# Patient Record
Sex: Male | Born: 1935 | Race: White | Hispanic: No | Smoking: Former smoker
Health system: Southern US, Community
[De-identification: ages and names within clinical notes are randomized; demographics above are authoritative.]

## PROBLEM LIST (undated history)

## (undated) DIAGNOSIS — Z7901 Long term (current) use of anticoagulants: Secondary | ICD-10-CM

## (undated) DIAGNOSIS — I4891 Unspecified atrial fibrillation: Secondary | ICD-10-CM

## (undated) DIAGNOSIS — K08109 Complete loss of teeth, unspecified cause, unspecified class: Secondary | ICD-10-CM

## (undated) DIAGNOSIS — T82330A Leakage of aortic (bifurcation) graft (replacement), initial encounter: Secondary | ICD-10-CM

## (undated) DIAGNOSIS — IMO0002 Reserved for concepts with insufficient information to code with codable children: Secondary | ICD-10-CM

## (undated) DIAGNOSIS — I10 Essential (primary) hypertension: Secondary | ICD-10-CM

## (undated) DIAGNOSIS — Z972 Presence of dental prosthetic device (complete) (partial): Secondary | ICD-10-CM

## (undated) HISTORY — PX: HERNIA REPAIR: SHX51

## (undated) HISTORY — PX: CHOLECYSTECTOMY: SHX55

## (undated) HISTORY — PX: EYE SURGERY: SHX253

## (undated) HISTORY — PX: ABDOMINAL AORTIC ANEURYSM REPAIR: SUR1152

## (undated) HISTORY — PX: MULTIPLE TOOTH EXTRACTIONS: SHX2053

---

## 2010-11-15 ENCOUNTER — Ambulatory Visit (HOSPITAL_BASED_OUTPATIENT_CLINIC_OR_DEPARTMENT_OTHER)
Admission: RE | Admit: 2010-11-15 | Discharge: 2010-11-15 | Disposition: A | Discharge status: Home / Self Care | Payer: Medicare Other | Attending: Specialist | Admitting: Specialist

## 2010-11-15 DIAGNOSIS — K219 Gastro-esophageal reflux disease without esophagitis: Secondary | ICD-10-CM | POA: Insufficient documentation

## 2010-11-15 DIAGNOSIS — I4891 Unspecified atrial fibrillation: Secondary | ICD-10-CM | POA: Insufficient documentation

## 2010-11-15 DIAGNOSIS — Z7901 Long term (current) use of anticoagulants: Secondary | ICD-10-CM | POA: Insufficient documentation

## 2010-11-15 DIAGNOSIS — H269 Unspecified cataract: Secondary | ICD-10-CM | POA: Insufficient documentation

## 2010-11-15 DIAGNOSIS — I10 Essential (primary) hypertension: Secondary | ICD-10-CM | POA: Insufficient documentation

## 2010-11-15 DIAGNOSIS — Z01812 Encounter for preprocedural laboratory examination: Secondary | ICD-10-CM | POA: Insufficient documentation

## 2010-11-15 LAB — POCT I-STAT, CHEM 8
Calcium, Ion: 1.3 mmol/L (ref 1.12–1.32)
Glucose, Bld: 93 mg/dL (ref 70–99)
HCT: 43 % (ref 39.0–52.0)
Hemoglobin: 14.6 g/dL (ref 13.0–17.0)
Potassium: 3.4 mEq/L — ABNORMAL LOW (ref 3.5–5.1)

## 2010-11-29 ENCOUNTER — Ambulatory Visit (HOSPITAL_BASED_OUTPATIENT_CLINIC_OR_DEPARTMENT_OTHER)
Admission: RE | Admit: 2010-11-29 | Discharge: 2010-11-29 | Disposition: A | Discharge status: Home / Self Care | Payer: Medicare Other | Source: Ambulatory Visit | Attending: Specialist | Admitting: Specialist

## 2010-11-29 DIAGNOSIS — Z01818 Encounter for other preprocedural examination: Secondary | ICD-10-CM | POA: Insufficient documentation

## 2010-11-29 DIAGNOSIS — H269 Unspecified cataract: Secondary | ICD-10-CM | POA: Insufficient documentation

## 2011-05-11 NOTE — Op Note (Signed)
  NAMEPHILEMON, RIEDESEL              ACCOUNT NO.:  [id number]  MEDICAL RECORD NO.:  [id number]          PATIENT TYPE:  AMB  LOCATION:  [facility]                         FACILITY:  [facility]  PHYSICIAN:  Chucky May, M.D.  DATE OF BIRTH:  1933-03-05  DATE OF PROCEDURE:  11/23/2010 DATE OF DISCHARGE:                              OPERATIVE REPORT   PREOPERATIVE DIAGNOSIS:  Cataract in the right eye.  POSTOPERATIVE DIAGNOSIS:  Cataract in the right eye,  OPERATION PERFORMED:  Cataract extraction with lens implantation in the right eye with limbal relaxing incisions performed by LenSx laser.  SURGEON:  Chucky May, M.D.  ASSISTANT:  There were no assistants.  FINDINGS:  Presence of the cataract consistent with decrease in visual acuity.  DESCRIPTION OF PROCEDURE:  The patient was brought to the main operating room after having received a LenSx laser treatment in the laser room performing limbal relaxing incisions and fragmentation of the nucleus as well as primary and secondary incisions and a capsulorrhexis.  The patient was then prepped and draped in the usual manner.  A lid speculum inserted and an entry into the anterior chamber was made through the primary laser incision.  The secondary side port was then opened by sharp dissection.  The capsulorrhexis was inspected and noted to be complete and was removed by capsulorrhexis forceps.  Nucleus was then mobilized by hydrodissection with 1% nonpreserved lidocaine. Phacoemulsification of the lens was then accomplished without difficulty.  The posterior capsule was polished.  Provisc was inserted and an intraocular lens was placed in the bag without difficulty.  The Provisc was removed by irrigation and aspiration.  The wound was closed by hydrodissection and checked for fluid leaks and none were noted.  The eye was dressed with topical Pred Forte and Vigamox drops and the patient was taken to the recovery room in excellent  condition where he and his family received written and verbal postoperative instructions and were scheduled for followup in 24 hours.          ______________________________ Chucky May, M.D.     DJD/MEDQ  D:  11/23/2010  T:  11/23/2010  Job:  [id number]  Electronically Signed by Nelson Chimes M.D. on 05/11/2011 09:35:19 AM

## 2011-05-11 NOTE — Op Note (Signed)
  [name], [name]              ACCOUNT NO.:  [id number]  MEDICAL RECORD NO.:  [id number]          PATIENT TYPE:  AMB  LOCATION:  [facility]                         FACILITY:  [facility]  PHYSICIAN:  Chucky May, M.D.  DATE OF BIRTH:  [birth year]  DATE OF PROCEDURE:  12/01/2010 DATE OF DISCHARGE:                              OPERATIVE REPORT   PREOPERATIVE DIAGNOSIS:  Cataract right eye.  POSTOPERATIVE DIAGNOSIS:  Cataract right eye.  INDICATIONS FOR SURGERY:  The patient is a 77 year old male with painless progressive decrease in vision, suggests difficulties seeing for reading.  PROCEDURE:  The patient was brought to the main operating room and placed in the supine position.  Anesthesia was obtained by means of topical 4% lidocaine drops with tetracaine.  The cornea was then entered temporally by a micro keratome with an additional port superiorly for paracentesis.  A circular capsular rhexis was then performed in a curvilinear fashion without difficulty.  The nucleus was mobilized by hydrodissection, followed by phacoemulsification of the nucleus without difficulty.  Cortical material was removed by irrigation and aspiration, and the posterior capsule was polished.  A posterior chamber lens implant, model SN60WF of 19.5 diopters was then placed in the bag without difficulty.  Residual cortical material was removed by irrigation and aspiration.  The wounds were hydrated with balanced salt solution and checked for fluid leaks and none were noted.  Eye was dressed with topical Pred Forte and Vigamox as well as a protective shield, and the patient was taken to the recovery room in excellent condition where he received written and verbal instructions for his postoperative care and was scheduled for a followup in 24 hours.          ______________________________ Chucky May, M.D.     DJD/MEDQ  D:  11/30/2010  T:  12/01/2010  Job:  [id number]  Electronically Signed by Nelson Chimes M.D. on 05/11/2011 09:35:28 AM

## 2015-11-30 ENCOUNTER — Other Ambulatory Visit: Payer: Self-pay | Admitting: Surgery

## 2015-11-30 DIAGNOSIS — T82330A Leakage of aortic (bifurcation) graft (replacement), initial encounter: Secondary | ICD-10-CM

## 2015-11-30 DIAGNOSIS — IMO0002 Reserved for concepts with insufficient information to code with codable children: Secondary | ICD-10-CM

## 2015-12-01 ENCOUNTER — Ambulatory Visit
Admission: RE | Admit: 2015-12-01 | Discharge: 2015-12-01 | Disposition: A | Discharge status: Home / Self Care | Payer: Medicare Other | Source: Ambulatory Visit | Attending: Surgery | Admitting: Surgery

## 2015-12-01 DIAGNOSIS — T82330A Leakage of aortic (bifurcation) graft (replacement), initial encounter: Secondary | ICD-10-CM

## 2015-12-01 DIAGNOSIS — IMO0002 Reserved for concepts with insufficient information to code with codable children: Secondary | ICD-10-CM

## 2015-12-01 HISTORY — DX: Unspecified atrial fibrillation: I48.91

## 2015-12-01 HISTORY — DX: Long term (current) use of anticoagulants: Z79.01

## 2015-12-01 HISTORY — DX: Essential (primary) hypertension: I10

## 2015-12-01 NOTE — Consult Note (Signed)
Chief Complaint: Patient was seen in consultation today for  Chief Complaint  Patient presents with  . Advice Only    Consult for Endoleak Repair     at the request of Cruz,Nestor Jr.  Referring Physician(s): [employer].  History of Present Illness: Jimmy Burke is a 82 y.o. male who underwent an abdominal aortic and biiliac stent graft for abdominal aortic aneurysm in 2015. He was followed by sonography and finally underwent a follow-up CTA early this year which showed a minimal type II endoleak. Preoperative aneurysm sac diameter was 5.6 cm in the postoperative aneurysm sac diameter by CT angiogram was measured at 6.4 cm. 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 The right-sided endoleak is prevented from repair by the IVC. Access into the left sided endoleak is possible. Access into the left lumbar artery is also possible. He denies any symptoms. Specifically, he has no abdominal pain, back pain, or lower extremity claudication symptoms. Limbs of the graft are also patent. He has a history of atrial fibrillation and is on Eliquis which likely contributes to delayed thrombosis. There are no interim CT angiograms. There are no interim CT angiograms. Past Medical History  Diagnosis Date  . Hypertension   . Atrial fibrillation ([place])   . Chronic anticoagulation     Past Surgical History  Procedure Laterality Date  . Cholecystectomy    . Abdominal aortic aneurysm repair    . Hernia repair Right     inguinal hernia  . Eye surgery      Bilateral cataract surgery      Allergies: Review of patient's allergies indicates no known allergies.  Medications: Prior to Admission medications   Medication Sig Start Date End Date Taking? Authorizing Provider  apixaban (ELIQUIS) 2.5 MG TABS tablet Take 2.5 mg by mouth 2 (two) times daily.   Yes Historical Provider, MD  aspirin 81 MG tablet Take 81 mg by mouth daily.   Yes Historical Provider,  MD  calcium carbonate 1250 MG capsule Take 1,250 mg by mouth 2 (two) times daily.   Yes Historical Provider, MD  diltiazem (CARDIZEM) 60 MG tablet Take 60 mg by mouth 2 (two) times daily.   Yes Historical Provider, MD  donepezil (ARICEPT) 5 MG tablet Take 5 mg by mouth at bedtime.   Yes Historical Provider, MD  hydrochlorothiazide (HYDRODIURIL) 25 MG tablet Take 25 mg by mouth daily. Take 1/4 tablet daily.   Yes Historical Provider, MD  ketoconazole (NIZORAL) 2 % cream Apply 1 application topically daily.   Yes Historical Provider, MD  metoprolol (LOPRESSOR) 100 MG tablet Take 100 mg by mouth 2 (two) times daily.   Yes Historical Provider, MD  simvastatin (ZOCOR) 20 MG tablet Take 20 mg by mouth daily.   Yes Historical Provider, MD     No family history on file.  Social History   Social History  . Marital Status: Widowed    Spouse Name: N/A  . Number of Children: N/A  . Years of Education: N/A   Social History Main Topics  . Smoking status: Former Research scientist (life sciences)  . Smokeless tobacco: Never Used  . Alcohol Use: 0.0 oz/week    0 Standard drinks or equivalent per week     Comment: occasionally    . Drug Use: No  . Sexual Activity: Not on file   Other Topics Concern  . Not on file   Social History Narrative  . No narrative on file  Review of Systems: A 12 point ROS discussed and pertinent positives are indicated in the HPI above.  All other systems are negative.  Review of Systems  Vital Signs: BP 167/88 mmHg  Pulse 85  Temp(Src) 97.8 F (36.6 C) (Oral)  Resp 14  Ht '5\' 6"'$  (1.676 m)  Wt 124 lb (56.246 kg)  BMI 20.02 kg/m2  SpO2 96%  Physical Exam  Constitutional: He is oriented to person, place, and time. He appears well-developed and well-nourished.  Cardiovascular: Normal rate.   Irregular consistent with atrial fibrillation  Pulmonary/Chest: Effort normal and breath sounds normal.  Abdominal: Soft.  Musculoskeletal:  Femoral pulses 2+ bilaterally.  Dorsalis pedis  and posterior tibial pulses are 1+ bilaterally  Neurological: He is alert and oriented to person, place, and time.  Skin: Skin is warm and dry.    Mallampati Score:    deferred  Imaging: No results found.  Labs:  CBC: No results for input(s): WBC, HGB, HCT, PLT in the last 8760 hours.  COAGS: No results for input(s): INR, APTT in the last 8760 hours.  BMP: No results for input(s): NA, K, CL, CO2, GLUCOSE, BUN, CALCIUM, CREATININE, GFRNONAA, GFRAA in the last 8760 hours.  Invalid input(s): CMP  LIVER FUNCTION TESTS: No results for input(s): BILITOT, AST, ALT, ALKPHOS, PROT, ALBUMIN in the last 8760 hours.  TUMOR MARKERS: No results for input(s): AFPTM, CEA, CA199, CHROMGRNA in the last 8760 hours.  Assessment and Plan:  Mr. Posten is status post aortobiiliac stent graft repair for aortic aneurysm and does have a type II endoleak. A recent CT angiogram is compared to preoperative CT angiograms from 2 years ago. The aneurysm sac has enlarged however there are no interim CT angiograms. I am recommending a conservative approach with a follow-up CT angiogram in 3-6 months. If the sac continues to enlarge and the endoleak continues, endoleak embolization repair would be indicated. I will order the angiogram and see him in follow-up with further recommendations. He agrees with this course of action.  Thank you for this interesting consult.  I greatly enjoyed meeting Shavon Zenz and look forward to participating in their care.  A copy of this report was sent to the requesting provider on this date.  Electronically Signed: Miel Wisener, ART A 12/01/2015, 9:11 AM   I spent a total of  40 Minutes   in face to face in clinical consultation, greater than 50% of which was  counseling/coordinating care for aortobiiliac stent graft repair and endoleak.

## 2016-04-04 ENCOUNTER — Other Ambulatory Visit (HOSPITAL_COMMUNITY): Payer: Self-pay | Admitting: Interventional Radiology

## 2016-04-04 ENCOUNTER — Other Ambulatory Visit: Payer: Self-pay | Admitting: *Deleted

## 2016-04-04 DIAGNOSIS — IMO0002 Reserved for concepts with insufficient information to code with codable children: Secondary | ICD-10-CM

## 2016-04-04 DIAGNOSIS — T82330A Leakage of aortic (bifurcation) graft (replacement), initial encounter: Secondary | ICD-10-CM

## 2016-04-20 ENCOUNTER — Encounter (HOSPITAL_BASED_OUTPATIENT_CLINIC_OR_DEPARTMENT_OTHER): Payer: Self-pay

## 2016-04-20 ENCOUNTER — Ambulatory Visit (HOSPITAL_BASED_OUTPATIENT_CLINIC_OR_DEPARTMENT_OTHER)
Admission: RE | Admit: 2016-04-20 | Discharge: 2016-04-20 | Disposition: A | Discharge status: Home / Self Care | Payer: Medicare Other | Source: Ambulatory Visit | Attending: Interventional Radiology | Admitting: Interventional Radiology

## 2016-04-20 DIAGNOSIS — IMO0002 Reserved for concepts with insufficient information to code with codable children: Secondary | ICD-10-CM

## 2016-04-20 DIAGNOSIS — N2889 Other specified disorders of kidney and ureter: Secondary | ICD-10-CM | POA: Diagnosis not present

## 2016-04-20 DIAGNOSIS — Z9889 Other specified postprocedural states: Secondary | ICD-10-CM | POA: Insufficient documentation

## 2016-04-20 DIAGNOSIS — J432 Centrilobular emphysema: Secondary | ICD-10-CM | POA: Insufficient documentation

## 2016-04-20 DIAGNOSIS — K5730 Diverticulosis of large intestine without perforation or abscess without bleeding: Secondary | ICD-10-CM | POA: Insufficient documentation

## 2016-04-20 DIAGNOSIS — T82330A Leakage of aortic (bifurcation) graft (replacement), initial encounter: Secondary | ICD-10-CM | POA: Diagnosis present

## 2016-04-20 DIAGNOSIS — X58XXXA Exposure to other specified factors, initial encounter: Secondary | ICD-10-CM | POA: Diagnosis not present

## 2016-04-20 DIAGNOSIS — I714 Abdominal aortic aneurysm, without rupture: Secondary | ICD-10-CM | POA: Insufficient documentation

## 2016-04-20 MED ORDER — IOPAMIDOL (ISOVUE-370) INJECTION 76%
100.0000 mL | Freq: Once | INTRAVENOUS | Status: AC | PRN
  Administered 2016-04-20: 100 mL via INTRAVENOUS

## 2016-05-02 ENCOUNTER — Ambulatory Visit
Admission: RE | Admit: 2016-05-02 | Discharge: 2016-05-02 | Disposition: A | Discharge status: Home / Self Care | Payer: Medicare Other | Source: Ambulatory Visit | Attending: Interventional Radiology | Admitting: Interventional Radiology

## 2016-05-02 DIAGNOSIS — T82330A Leakage of aortic (bifurcation) graft (replacement), initial encounter: Secondary | ICD-10-CM

## 2016-05-02 DIAGNOSIS — IMO0002 Reserved for concepts with insufficient information to code with codable children: Secondary | ICD-10-CM

## 2016-05-02 HISTORY — PX: IR GENERIC HISTORICAL: IMG1180011

## 2016-05-02 HISTORY — DX: Reserved for concepts with insufficient information to code with codable children: IMO0002

## 2016-05-02 HISTORY — DX: Leakage of aortic (bifurcation) graft (replacement), initial encounter: T82.330A

## 2016-05-02 NOTE — Progress Notes (Signed)
Patient ID: Jimmy Burke, male   DOB: Apr 12, 1933, 82 y.o.   MRN: [id number]    Chief Complaint: Patient was seen in consultation today for  Chief Complaint  Patient presents with  . Follow-up    6 mo follow up post EVAR Type 2 Endoleak     at the request of Jaelin Fackler  Referring Physician(s): Anuj Summons  Supervising Physician: Marybelle Killings  Patient Status: Outpatient  History of Present Illness: Jimmy Burke is a 82 y.o. male who is status post aortobiiliac stent graft in 2015. He underwent follow-up CT angiogram in February of this year which showed a type II endoleak. His preop sac diameter was 5.8 cm and postop the diameter was 6.4 cm. He is completely asymptomatic and feels fine. He denies any abdominal pain or ischemic symptoms in his lower extremities. He continues to take anticoagulants for atrial fibrillation and denies any stroke symptoms. The type II endoleak is related to either an IMA or lumbar branch, or possibly both. It is primarily on the right side of the sac. The follow-up scan was performed this week.  Past Medical History  Diagnosis Date  . Hypertension   . Atrial fibrillation ([place])   . Chronic anticoagulation   . Endoleak post (EVAR) endovascular aneurysm repair [facility])     Past Surgical History  Procedure Laterality Date  . Cholecystectomy    . Abdominal aortic aneurysm repair    . Hernia repair Right     inguinal hernia  . Eye surgery      Bilateral cataract surgery      Allergies: Review of patient's allergies indicates no known allergies.  Medications: Prior to Admission medications   Medication Sig Start Date End Date Taking? Authorizing Provider  apixaban (ELIQUIS) 2.5 MG TABS tablet Take 2.5 mg by mouth 2 (two) times daily.   Yes Historical Provider, MD  aspirin 81 MG tablet Take 81 mg by mouth daily.   Yes Historical Provider, MD  calcium carbonate 1250 MG capsule Take 1,250 mg by mouth 2 (two) times daily.   Yes Historical Provider, MD    diltiazem (CARDIZEM) 60 MG tablet Take 60 mg by mouth 2 (two) times daily.   Yes Historical Provider, MD  donepezil (ARICEPT) 5 MG tablet Take 5 mg by mouth at bedtime.   Yes Historical Provider, MD  hydrochlorothiazide (HYDRODIURIL) 25 MG tablet Take 25 mg by mouth daily. Take 1/4 tablet daily.   Yes Historical Provider, MD  ketoconazole (NIZORAL) 2 % cream Apply 1 application topically daily.   Yes Historical Provider, MD  metoprolol (LOPRESSOR) 100 MG tablet Take 100 mg by mouth 2 (two) times daily.   Yes Historical Provider, MD  simvastatin (ZOCOR) 20 MG tablet Take 20 mg by mouth daily.   Yes Historical Provider, MD     No family history on file.  Social History   Social History  . Marital Status: Widowed    Spouse Name: N/A  . Number of Children: N/A  . Years of Education: N/A   Social History Main Topics  . Smoking status: Former Research scientist (life sciences)  . Smokeless tobacco: Never Used  . Alcohol Use: 0.0 oz/week    0 Standard drinks or equivalent per week     Comment: occasionally    . Drug Use: No  . Sexual Activity: Not on file   Other Topics Concern  . Not on file   Social History Narrative      Review of Systems: A 12 point ROS discussed and pertinent positives  are indicated in the HPI above.  All other systems are negative.  Review of Systems  Vital Signs: BP 130/78 mmHg  Pulse 76  Temp(Src) 97.6 F (36.4 C) (Oral)  Resp 14  Ht '5\' 5"'$  (1.651 m)  Wt 124 lb (56.246 kg)  BMI 20.63 kg/m2  SpO2 97%  Physical Exam  Constitutional: He is oriented to person, place, and time. He appears well-developed and well-nourished.  HENT:  Head: Normocephalic and atraumatic.  Neurological: He is alert and oriented to person, place, and time.     Imaging: Ct Angio Abd/pel W/ And/or W/o  04/20/2016  CLINICAL DATA:  82 year old male with a history of abdominal aortic aneurysm status post endovascular aortic repair. EXAM: CTA ABDOMEN AND PELVIS wITHOUT AND WITH CONTRAST TECHNIQUE:  Multidetector CT imaging of the abdomen and pelvis was performed using the standard protocol during bolus administration of intravenous contrast. Multiplanar reconstructed images and MIPs were obtained and reviewed to evaluate the vascular anatomy. CONTRAST:  100 mL Isovue 370 COMPARISON:  Prior CT abdomen/pelvis 11/28/2015 FINDINGS: VASCULAR Aorta: Fusiform abdominal aortic aneurysm status post endovascular repair. The endo graft extends from just below the renal arteries into the bilateral common iliac arteries. The excluded aneurysm sac remains stable at 6.5 x 5.5 cm (insignificantly changed compared to 6.4 x 5.5 cm previously). There is trace contrast blush within the aneurysm sac adjacent to the origin of the inferior mesenteric artery likely representing a small type 2 endoleak. This appears less conspicuous than seen on prior imaging. Celiac: Catheterization sclerotic plaque at the origin without significant stenosis. Conventional hepatic arterial anatomy. No visceral artery dissection or aneurysm. SMA: Atherosclerotic plaque at the origin results in mild narrowing. The distal branches are widely patent. Renals: There are 2 right-sided renal arteries including a dominant renal artery and a small accessory branch to the lower pole. Atherosclerotic plaque at the origins without definitive stenosis. On the left, there is a single renal artery. Atherosclerotic plaque at the origin results in mild narrowing. IMA: Patent and possibly the source of a small type 2 endoleak. Inflow: The iliac limbs are widely patent. The bilateral internal iliac arteries are patent. Scattered atherosclerotic plaque throughout the external iliac arteries without focal stenosis. Proximal Outflow: Calcified plaque along the posterior wall of the common femoral arteries without significant stenosis. The visualized bilateral profunda and superficial femoral arteries are patent. Veins: No focal venous abnormality. NON-VASCULAR Lower Chest:  Mild centrilobular emphysema. No consolidation or pleural effusion. Mild dilatation of the right atrium. No pericardial effusion. Unremarkable distal thoracic esophagus. Abdomen: Unremarkable CT appearance of the stomach, duodenum, spleen, adrenal glands and pancreas. Normal hepatic contour morphology. Extensive pneumobilia consistent with prior ERCP. The gallbladder is surgically absent. No intra or extrahepatic biliary ductal dilatation. Multifocal renal cortical scarring bilaterally. The left kidney is slightly smaller than the right. No evidence of bowel wall thickening or obstruction. Colonic diverticular disease without CT evidence of active inflammation. Pelvis: Mild nonspecific bladder wall thickening. The bladder is under distended. No suspicious adenopathy or free fluid. Bones/Soft Tissues: No acute fracture or aggressive appearing lytic or blastic osseous lesion. Review of the MIP images confirms the above findings. IMPRESSION: 1. Stable size of fusiform abdominal aortic aneurysm status post endovascular aortic repair. Decreasing conspicuity of the small type 2 endoleak compared to 11/28/2015. 2. No new or acute vascular abnormality. 3. Pneumobilia suggests prior ERCP and patent biliary sphincter. 4. Mild centrilobular pulmonary emphysema. 5. Colonic diverticular disease without CT evidence of active inflammation. 6. Multifocal renal  cortical scarring bilaterally. 7. Additional ancillary findings as above without significant interval change. Signed, Criselda Peaches, MD [facility]iology Electronically Signed   By: Jacqulynn Cadet M.D.   On: 04/20/2016 15:22    Labs:  CBC: No results for input(s): WBC, HGB, HCT, PLT in the last 8760 hours.  COAGS: No results for input(s): INR, APTT in the last 8760 hours.  BMP: No results for input(s): NA, K, CL, CO2, GLUCOSE, BUN, CALCIUM, CREATININE, GFRNONAA, GFRAA in the last 8760 hours.  Invalid  input(s): CMP  LIVER FUNCTION TESTS: No results for input(s): BILITOT, AST, ALT, ALKPHOS, PROT, ALBUMIN in the last 8760 hours.  TUMOR MARKERS: No results for input(s): AFPTM, CEA, CA199, CHROMGRNA in the last 8760 hours.  Assessment and Plan:  The follow-up imaging performed this week continues to show a type II endoleak, however the overall sac diameter is stable compared with February. The endoleak is likely primarily due to the IMA. I suspect that the lumbar artery is an outflow vessel. The endoleak can be approached via the SMA and IMA. A direct puncture would be problematic, given that the endoleak is primarily right-sided within the sac. Because there has been no interval growth, continued follow-up is recommended. Anticoagulation will lengthen the time to thrombosis of the endoleak. We are both comfortable with this course of action and he will undergo follow-up CT angiogram in 6 months.  Thank you for this interesting consult.  I greatly enjoyed meeting Jimmy Burke and look forward to participating in their care.  A copy of this report was sent to the requesting provider on this date.  Electronically Signed: Izick Gasbarro, ART A 05/02/2016, 9:29 AM   I spent a total of   15 Minutes in face to face in clinical consultation, greater than 50% of which was counseling/coordinating care for aortobiiliac stent graft repair and endoleak.

## 2016-07-13 ENCOUNTER — Encounter: Payer: Self-pay | Admitting: Interventional Radiology

## 2016-11-06 ENCOUNTER — Other Ambulatory Visit (HOSPITAL_COMMUNITY): Payer: Self-pay | Admitting: Interventional Radiology

## 2016-11-06 ENCOUNTER — Other Ambulatory Visit: Payer: Self-pay | Admitting: *Deleted

## 2016-11-06 DIAGNOSIS — I714 Abdominal aortic aneurysm, without rupture, unspecified: Secondary | ICD-10-CM

## 2016-11-06 DIAGNOSIS — IMO0002 Reserved for concepts with insufficient information to code with codable children: Secondary | ICD-10-CM

## 2016-11-06 DIAGNOSIS — T82330A Leakage of aortic (bifurcation) graft (replacement), initial encounter: Secondary | ICD-10-CM

## 2016-11-15 ENCOUNTER — Ambulatory Visit
Admission: RE | Admit: 2016-11-15 | Discharge: 2016-11-15 | Disposition: A | Discharge status: Home / Self Care | Payer: Medicare Other | Source: Ambulatory Visit | Attending: Interventional Radiology | Admitting: Interventional Radiology

## 2016-11-15 ENCOUNTER — Encounter (HOSPITAL_BASED_OUTPATIENT_CLINIC_OR_DEPARTMENT_OTHER): Payer: Self-pay

## 2016-11-15 ENCOUNTER — Ambulatory Visit (HOSPITAL_BASED_OUTPATIENT_CLINIC_OR_DEPARTMENT_OTHER)
Admission: RE | Admit: 2016-11-15 | Discharge: 2016-11-15 | Disposition: A | Discharge status: Home / Self Care | Payer: Medicare Other | Source: Ambulatory Visit | Attending: Interventional Radiology | Admitting: Interventional Radiology

## 2016-11-15 DIAGNOSIS — IMO0002 Reserved for concepts with insufficient information to code with codable children: Secondary | ICD-10-CM

## 2016-11-15 DIAGNOSIS — I714 Abdominal aortic aneurysm, without rupture, unspecified: Secondary | ICD-10-CM

## 2016-11-15 DIAGNOSIS — T82330A Leakage of aortic (bifurcation) graft (replacement), initial encounter: Secondary | ICD-10-CM | POA: Insufficient documentation

## 2016-11-15 DIAGNOSIS — K5730 Diverticulosis of large intestine without perforation or abscess without bleeding: Secondary | ICD-10-CM | POA: Insufficient documentation

## 2016-11-15 HISTORY — PX: IR GENERIC HISTORICAL: IMG1180011

## 2016-11-15 MED ORDER — IOPAMIDOL (ISOVUE-370) INJECTION 76%
100.0000 mL | Freq: Once | INTRAVENOUS | Status: AC | PRN
  Administered 2016-11-15: 100 mL via INTRAVENOUS

## 2016-11-15 NOTE — Progress Notes (Signed)
Patient ID: Jimmy Burke, male   DOB: [birth year], 83 y.o.   MRN: [id number]       Chief Complaint: Patient was seen in consultation today for  Chief Complaint  Patient presents with  . 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 He is status post aortobiiliac stent graft in 2015. At the end of 2017, his CTA demonstrated a type II endoleak with a maximal diameter of 6.4 cm. Today, a repeat scan demonstrates that the endoleak continues and the maximal sac diameter is 6.8 cm. Delayed images demonstrate enhancement of nearly entire aneurysm sac there are IMA and lumbar artery branches which may be the culprit. A type I endoleak is not entirely excluded. Of note, the patient is on Ellik was for atrial fibrillation. This is likely a contributor factor to lack of thrombosis of the aneurysm sac. He continues to be asymptomatic and feels well.  Past Medical History:  Diagnosis Date  . Atrial fibrillation ([place])   . Chronic anticoagulation   . Endoleak post (EVAR) endovascular aneurysm repair ([place])   . Hypertension     Past Surgical History:  Procedure Laterality Date  . ABDOMINAL AORTIC ANEURYSM REPAIR    . CHOLECYSTECTOMY    . EYE SURGERY     Bilateral cataract surgery    . HERNIA REPAIR Right    inguinal hernia  . IR GENERIC HISTORICAL  05/02/2016   IR RADIOLOGIST EVAL & MGMT 05/02/2016 Marybelle Killings, MD [facility] INTERV RAD  . IR GENERIC HISTORICAL  11/15/2016   IR RADIOLOGIST EVAL & MGMT 11/15/2016 Marybelle Killings, MD [facility] INTERV RAD    Allergies: Patient has no known allergies.  Medications: Prior to Admission medications   Medication Sig Start Date End Date Taking? Authorizing Provider  apixaban (ELIQUIS) 2.5 MG TABS tablet Take 2.5 mg by mouth 2 (two) times daily.   Yes Historical Provider, MD    aspirin 81 MG tablet Take 81 mg by mouth daily.   Yes Historical Provider, MD  calcium carbonate 1250 MG capsule Take 1,250 mg by mouth 2 (two) times daily.   Yes Historical Provider, MD  diltiazem (CARDIZEM) 60 MG tablet Take 60 mg by mouth 2 (two) times daily.   Yes Historical Provider, MD  donepezil (ARICEPT) 5 MG tablet Take 5 mg by mouth at bedtime.   Yes Historical Provider, MD  hydrochlorothiazide (HYDRODIURIL) 25 MG tablet Take 25 mg by mouth daily. Take 1/4 tablet daily.   Yes Historical Provider, MD  ketoconazole (NIZORAL) 2 % cream Apply 1 application topically daily.   Yes Historical Provider, MD  metoprolol (LOPRESSOR) 100 MG tablet Take 100 mg by mouth 2 (two) times daily.   Yes Historical Provider, MD  simvastatin (ZOCOR) 20 MG tablet Take 20 mg by mouth daily.   Yes Historical Provider, MD     No family history on file.  Social History   Social History  . Marital status: Widowed    Spouse name: N/A  . Number of children: N/A  . Years of education: N/A   Social History Main Topics  . Smoking status: Former Research scientist (life sciences)  . Smokeless tobacco: Never Used  . Alcohol use 0.0 oz/week     Comment: occasionally    . Drug use: No  . Sexual activity: Not on file  Other Topics Concern  . Not on file   Social History Narrative  . No narrative on file      Review of Systems: A 12 point ROS discussed and pertinent positives are indicated in the HPI above.  All other systems are negative.  Review of Systems  Vital Signs: BP (!) 153/75 (BP Location: Left Arm, Patient Position: Sitting, Cuff Size: Normal)   Pulse 95   Temp 97.5 F (36.4 C) (Oral)   Resp 15   Ht '5\' 8"'$  (1.727 m)   Wt 122 lb (55.3 kg)   SpO2 96%   BMI 18.55 kg/m   Physical Exam  Constitutional: He is oriented to person, place, and time. He appears well-developed and well-nourished.  Cardiovascular: Normal rate.   Pulmonary/Chest: Effort normal.  Neurological: He is alert and oriented to person, place,  and time.    Mallampati Score:     Imaging: Ir Radiologist Eval & Mgmt  Result Date: 11/15/2016 Please refer to "Notes" to see consult details.  Ct Angio Abd/pel W/ And/or W/o  Result Date: 11/15/2016 CLINICAL DATA:  F/u endoleak of aortic graft. EXAM: CTA ABDOMEN AND PELVIS WITH CONTRAST TECHNIQUE: Multidetector CT imaging of the abdomen and pelvis was performed using the standard protocol during bolus administration of intravenous contrast. Multiplanar reconstructed images and MIPs were obtained and reviewed to evaluate the vascular anatomy. CONTRAST:  100 mL Isovue 370 IV COMPARISON:  04/20/2016 FINDINGS: VASCULAR Aorta: Scattered calcified plaque in the visualized distal descending thoracic and suprarenal segments. Patent bifurcated infrarenal stent graft with persistent endoleak probably related to patent IMA and lumbar vessels involving much of the native aneurysm sac. Interval increase in native sac diameter to 6.9 cm, previously 6.5. Celiac: Patent without evidence of aneurysm, dissection, vasculitis or significant stenosis. SMA: Patent without evidence of aneurysm, dissection, vasculitis or significant stenosis. Classic distal branch anatomy. Renals: Duplicated on the left, inferior dominant. Duplicated on the right, superior dominant. Stent tines cover the origin of the diminutive the inferior right renal artery, without any associated parenchymal perfusion changes. Remainder of renal arteries appear widely patent. IMA: Probable continued patency and association with the type 2 aortic endoleak. Inflow: Both limbs of the stent graft are patent, extending to the distal common iliac arteries, well apposed. Scattered calcified plaque in the native external and internal iliac arteries without aneurysm, dissection, or stenosis. Proximal Outflow: Bilateral common femoral and visualized portions of the superficial and profunda femoral arteries are patent without evidence of aneurysm, dissection,  vasculitis or significant stenosis. Veins: Patent hepatic veins, portal vein, SMV, splenic vein, bilateral renal veins, IVC. Review of the MIP images confirms the above findings. NON-VASCULAR Lower chest: No acute abnormality. Hepatobiliary: Pneumobilia as before, indicating patency of a presumed previous sphincterotomy. No focal liver lesion. Previous cholecystectomy. Pancreas: Unremarkable. No pancreatic ductal dilatation or surrounding inflammatory changes. Spleen: Normal in size without focal abnormality. Adrenals/Urinary Tract: Normal adrenal glands. Lobular renal contour bilaterally without hydronephrosis or focal lesion. Urinary bladder incompletely distended. Stomach/Bowel: Stomach, small bowel, colon nondilated. Appendix not discretely identified. Innumerable distal descending and sigmoid diverticula without adjacent inflammatory/edematous change Lymphatic: No adenopathy identified. Reproductive: Mild prostatic enlargement. Other: No ascites.  No free air. Musculoskeletal: Spondylitic changes throughout the lumbar spine with large anterior spurs. Negative for fracture or worrisome bone lesion. IMPRESSION: VASCULAR 1. Continued type 2 endoleak about the infrarenal aortic stent graft, with continued enlargement of the native aneurysm sac to 6.9 cm, from 6.5 on 04/20/2016. NON-VASCULAR 1. No acute findings. 2. Descending  and sigmoid diverticulosis. Electronically Signed   By: Lucrezia Europe M.D.   On: 11/15/2016 11:05    Labs:  CBC: No results for input(s): WBC, HGB, HCT, PLT in the last 8760 hours.  COAGS: No results for input(s): INR, APTT in the last 8760 hours.  BMP: No results for input(s): NA, K, CL, CO2, GLUCOSE, BUN, CALCIUM, CREATININE, GFRNONAA, GFRAA in the last 8760 hours.  Invalid input(s): CMP  LIVER FUNCTION TESTS: No results for input(s): BILITOT, AST, ALT, ALKPHOS, PROT, ALBUMIN in the last 8760 hours.  TUMOR MARKERS: No results for input(s): AFPTM, CEA, CA199, CHROMGRNA in the  last 8760 hours.  Assessment and Plan:  Mr. Tucholski most likely has a type II endoleak with continued enlargement of the aneurysm sac. Although he is asymptomatic, I have recommended definitive treatment with embolization. He will require conventional angiography to confirm that this is a type II endoleak and rule out a type I endoleak. If the endoleak can be reached intravascularly via the IMA or lumbar arteries, embolization will be attempted. If this fails, a second and procedure with anesthesia and direct sac puncture can be attempted at a later date.  Thank you for this interesting consult.  I greatly enjoyed meeting Jimmy Burke and look forward to participating in their care.  A copy of this report was sent to the requesting provider on this date.  Electronically Signed: Dhiren Azimi, ART A 11/15/2016, 4:39 PM   I spent a total of   25 Minutes in face to face in clinical consultation, greater than 50% of which was counseling/coordinating care for endoleak.

## 2016-11-16 ENCOUNTER — Other Ambulatory Visit (HOSPITAL_COMMUNITY): Payer: Self-pay | Admitting: Interventional Radiology

## 2016-11-28 ENCOUNTER — Other Ambulatory Visit (HOSPITAL_COMMUNITY): Payer: Self-pay | Admitting: Interventional Radiology

## 2016-11-28 DIAGNOSIS — T82330A Leakage of aortic (bifurcation) graft (replacement), initial encounter: Secondary | ICD-10-CM

## 2016-11-28 DIAGNOSIS — IMO0002 Reserved for concepts with insufficient information to code with codable children: Secondary | ICD-10-CM

## 2017-01-01 ENCOUNTER — Other Ambulatory Visit: Payer: Medicare Other

## 2017-01-01 ENCOUNTER — Encounter (HOSPITAL_BASED_OUTPATIENT_CLINIC_OR_DEPARTMENT_OTHER): Payer: Self-pay

## 2017-01-01 ENCOUNTER — Other Ambulatory Visit (HOSPITAL_BASED_OUTPATIENT_CLINIC_OR_DEPARTMENT_OTHER): Payer: Medicare Other

## 2017-02-06 ENCOUNTER — Ambulatory Visit
Admission: RE | Admit: 2017-02-06 | Discharge: 2017-02-06 | Disposition: A | Discharge status: Home / Self Care | Payer: Medicare Other | Source: Ambulatory Visit | Attending: Interventional Radiology | Admitting: Interventional Radiology

## 2017-02-06 DIAGNOSIS — T82330A Leakage of aortic (bifurcation) graft (replacement), initial encounter: Secondary | ICD-10-CM

## 2017-02-06 DIAGNOSIS — IMO0002 Reserved for concepts with insufficient information to code with codable children: Secondary | ICD-10-CM

## 2017-02-06 HISTORY — PX: IR RADIOLOGIST EVAL & MGMT: IMG5224

## 2017-02-06 NOTE — Progress Notes (Signed)
Chief Complaint: Patient was seen in consultation today for  Chief Complaint  Patient presents with  . Follow-up    2 mo follow up Type 2 Endoleak Repair     at the request of Linville Decarolis  Referring Physician(s): Mackensie Pilson  History of Present Illness: Jimmy Burke is a 83 y.o. male who underwent lumbar artery embolization for a type II endoleak and aortobiiliac stent graft. Since then, he has had no complaints and feels well. He denies abdominal pain or leg pain. He denies any groin symptoms. Access during the procedure was through the left groin. It was performed 1 month ago. He underwent CT scan and is here for follow-up.  Past Medical History:  Diagnosis Date  . Atrial fibrillation ([place])   . Chronic anticoagulation   . Endoleak post (EVAR) endovascular aneurysm repair ([place])   . Hypertension     Past Surgical History:  Procedure Laterality Date  . ABDOMINAL AORTIC ANEURYSM REPAIR    . CHOLECYSTECTOMY    . EYE SURGERY     Bilateral cataract surgery    . HERNIA REPAIR Right    inguinal hernia  . IR GENERIC HISTORICAL  05/02/2016   IR RADIOLOGIST EVAL & MGMT 05/02/2016 Marybelle Killings, MD [facility] INTERV RAD  . IR GENERIC HISTORICAL  11/15/2016   IR RADIOLOGIST EVAL & MGMT 11/15/2016 Marybelle Killings, MD [facility] INTERV RAD    Allergies: Patient has no known allergies.  Medications: Prior to Admission medications   Medication Sig Start Date End Date Taking? Authorizing Provider  apixaban (ELIQUIS) 2.5 MG TABS tablet Take 2.5 mg by mouth 2 (two) times daily.   Yes Historical Provider, MD  aspirin 81 MG tablet Take 81 mg by mouth daily.   Yes Historical Provider, MD  calcium carbonate 1250 MG capsule Take 1,250 mg by mouth 2 (two) times daily.   Yes Historical Provider, MD  diltiazem (CARDIZEM) 60 MG tablet Take 60 mg by mouth 2 (two) times daily.   Yes Historical Provider, MD  donepezil (ARICEPT) 5 MG tablet Take 5 mg by mouth at bedtime.   Yes Historical Provider, MD    hydrochlorothiazide (HYDRODIURIL) 25 MG tablet Take 25 mg by mouth daily. Take 1/4 tablet daily.   Yes Historical Provider, MD  ketoconazole (NIZORAL) 2 % cream Apply 1 application topically daily.   Yes Historical Provider, MD  metoprolol (LOPRESSOR) 100 MG tablet Take 100 mg by mouth 2 (two) times daily.   Yes Historical Provider, MD  simvastatin (ZOCOR) 20 MG tablet Take 20 mg by mouth daily.   Yes Historical Provider, MD     No family history on file.  Social History   Social History  . Marital status: Widowed    Spouse name: N/A  . Number of children: N/A  . Years of education: N/A   Social History Main Topics  . Smoking status: Former Research scientist (life sciences)  . Smokeless tobacco: Never Used  . Alcohol use 0.0 oz/week     Comment: occasionally    . Drug use: No  . Sexual activity: Not on file   Other Topics Concern  . Not on file   Social History Narrative  . No narrative on file      Review of Systems: A 12 point ROS discussed and pertinent positives are indicated in the HPI above.  All other systems are negative.  Review of Systems  Vital Signs: BP (!) 142/76 (BP Location: Left Arm, Patient Position: Sitting, Cuff Size: Normal)   Pulse 78  Temp 98 F (36.7 C) (Oral)   Resp 14   Ht '5\' 6"'$  (1.676 m)   Wt 124 lb (56.2 kg)   SpO2 96%   BMI 20.01 kg/m   Physical Exam  Constitutional: He is oriented to person, place, and time. He appears well-developed and well-nourished.  Cardiovascular:  Left groin is soft and without evidence of hematoma or pseudoaneurysm. Left ankle pulses are intact.  Neurological: He is alert and oriented to person, place, and time.     Imaging: No results found.  CT angiogram of the abdomen and pelvis was performed today. Briefly, this demonstrated that the aneurysm sac diameter is stable at 6.8 cm. After embolization of bilateral lumbar arteries, the endoleak persists but is much less prominent than on the prior study. This is best appreciated on  venous phase images. Labs:  CBC: No results for input(s): WBC, HGB, HCT, PLT in the last 8760 hours.  COAGS: No results for input(s): INR, APTT in the last 8760 hours.  BMP: No results for input(s): NA, K, CL, CO2, GLUCOSE, BUN, CALCIUM, CREATININE, GFRNONAA, GFRAA in the last 8760 hours.  Invalid input(s): CMP  LIVER FUNCTION TESTS: No results for input(s): BILITOT, AST, ALT, ALKPHOS, PROT, ALBUMIN in the last 8760 hours.  TUMOR MARKERS: No results for input(s): AFPTM, CEA, CA199, CHROMGRNA in the last 8760 hours.  Assessment and Plan:  After lumbar artery embolization, the type II endoleak persists, but it is much less prominent and the aneurysm sac diameter is stable at 6.8 cm. My recommendations are to maintain a conservative course and obtain follow-up imaging at 3 months. If sac diameter continues to increase, translumbar direct sac puncture can be considered.  Thank you for this interesting consult.  I greatly enjoyed meeting Jimmy Burke and look forward to participating in their care.  A copy of this report was sent to the requesting provider on this date.  Electronically Signed: Aideen Fenster, ART A 02/06/2017, 2:48 PM   I spent a total of   15 Minutes in face to face in clinical consultation, greater than 50% of which was counseling/coordinating care for arterial embolization.

## 2017-03-29 ENCOUNTER — Encounter: Payer: Self-pay | Admitting: Interventional Radiology

## 2017-04-23 ENCOUNTER — Other Ambulatory Visit: Payer: Self-pay | Admitting: *Deleted

## 2017-04-23 DIAGNOSIS — IMO0002 Reserved for concepts with insufficient information to code with codable children: Secondary | ICD-10-CM

## 2017-04-23 DIAGNOSIS — T82330A Leakage of aortic (bifurcation) graft (replacement), initial encounter: Secondary | ICD-10-CM

## 2017-04-24 ENCOUNTER — Other Ambulatory Visit: Payer: Self-pay | Admitting: Interventional Radiology

## 2017-04-24 DIAGNOSIS — T82330A Leakage of aortic (bifurcation) graft (replacement), initial encounter: Secondary | ICD-10-CM

## 2017-04-24 DIAGNOSIS — IMO0002 Reserved for concepts with insufficient information to code with codable children: Secondary | ICD-10-CM

## 2017-05-16 ENCOUNTER — Ambulatory Visit
Admission: RE | Admit: 2017-05-16 | Discharge: 2017-05-16 | Disposition: A | Discharge status: Home / Self Care | Payer: Medicare Other | Source: Ambulatory Visit | Attending: Interventional Radiology | Admitting: Interventional Radiology

## 2017-05-16 DIAGNOSIS — T82330A Leakage of aortic (bifurcation) graft (replacement), initial encounter: Secondary | ICD-10-CM

## 2017-05-16 DIAGNOSIS — IMO0002 Reserved for concepts with insufficient information to code with codable children: Secondary | ICD-10-CM

## 2017-05-16 HISTORY — PX: IR RADIOLOGIST EVAL & MGMT: IMG5224

## 2017-05-16 NOTE — Progress Notes (Signed)
Patient ID: Jimmy Burke, male   DOB: 1933-01-10, 83 y.o.   MRN: [id number]       Chief Complaint: Patient was seen in consultation today for  Chief Complaint  Patient presents with  . Follow-up    6 mo follow up Type 2 Endoleak Repair   at the request of Tiombe Tomeo  Referring Physician(s): Alfreda Hammad  History of Present Illness: Jimmy Burke is a 83 y.o. male who returns in follow-up of his type II endoleak status post lumbar artery embolization. He denies any symptoms and feels great. Specifically, he denies any abdominal or groin pain.  Past Medical History:  Diagnosis Date  . Atrial fibrillation ([place])   . Chronic anticoagulation   . Endoleak post (EVAR) endovascular aneurysm repair ([place])   . Hypertension     Past Surgical History:  Procedure Laterality Date  . ABDOMINAL AORTIC ANEURYSM REPAIR    . CHOLECYSTECTOMY    . EYE SURGERY     Bilateral cataract surgery    . HERNIA REPAIR Right    inguinal hernia  . IR GENERIC HISTORICAL  05/02/2016   IR RADIOLOGIST EVAL & MGMT 05/02/2016 Marybelle Killings, MD [facility] INTERV RAD  . IR GENERIC HISTORICAL  11/15/2016   IR RADIOLOGIST EVAL & MGMT 11/15/2016 Marybelle Killings, MD [facility] INTERV RAD  . IR RADIOLOGIST EVAL & MGMT  02/06/2017    Allergies: Patient has no known allergies.  Medications: Prior to Admission medications   Medication Sig Start Date End Date Taking? Authorizing Provider  apixaban (ELIQUIS) 2.5 MG TABS tablet Take 2.5 mg by mouth 2 (two) times daily.   Yes [provider]  aspirin 81 MG tablet Take 81 mg by mouth daily.   Yes [provider]  calcium carbonate 1250 MG capsule Take 1,250 mg by mouth 2 (two) times daily.   Yes [provider]  diltiazem (CARDIZEM) 60 MG tablet Take 60 mg by mouth 2 (two) times daily.   Yes [provider]  donepezil (ARICEPT) 5 MG tablet Take 5 mg by mouth at bedtime.   Yes [provider]  hydrochlorothiazide (HYDRODIURIL) 25 MG tablet Take 25  mg by mouth daily. Take 1/4 tablet daily.   Yes [provider]  ketoconazole (NIZORAL) 2 % cream Apply 1 application topically daily.   Yes [provider]  metoprolol (LOPRESSOR) 100 MG tablet Take 100 mg by mouth 2 (two) times daily.   Yes [provider]  simvastatin (ZOCOR) 20 MG tablet Take 20 mg by mouth daily.   Yes [provider]     No family history on file.  Social History   Social History  . Marital status: Widowed    Spouse name: N/A  . Number of children: N/A  . Years of education: N/A   Social History Main Topics  . Smoking status: Former Research scientist (life sciences)  . Smokeless tobacco: Never Used  . Alcohol use 0.0 oz/week     Comment: occasionally    . Drug use: No  . Sexual activity: Not on file   Other Topics Concern  . Not on file   Social History Narrative  . No narrative on file     Review of Systems: A 12 point ROS discussed and pertinent positives are indicated in the HPI above.  All other systems are negative.  Review of Systems  Vital Signs: BP 135/64   Pulse 91   Temp 97.6 F (36.4 C) (Oral)   Resp 15   Ht 5\' 4"  (1.626 m)  Wt 122 lb (55.3 kg)   SpO2 97%   BMI 20.94 kg/m   Physical Exam   Imaging: No results found.  Labs:  CBC: No results for input(s): WBC, HGB, HCT, PLT in the last 8760 hours.  COAGS: No results for input(s): INR, APTT in the last 8760 hours.  BMP: No results for input(s): NA, K, CL, CO2, GLUCOSE, BUN, CALCIUM, CREATININE, GFRNONAA, GFRAA in the last 8760 hours.  Invalid input(s): CMP  LIVER FUNCTION TESTS: No results for input(s): BILITOT, AST, ALT, ALKPHOS, PROT, ALBUMIN in the last 8760 hours.  TUMOR MARKERS: No results for input(s): AFPTM, CEA, CA199, CHROMGRNA in the last 8760 hours.  Assessment and Plan:  The most recent CT demonstrates little to no growth and the aneurysm sac size, maximal diameter previously was 6.9 cm and today, maximal diameter was 7.0 cm. In addition,  the amount of enhancement on the arterial phase has been markedly reduced. In addition, he is completely asymptomatic. No intervention is recommended at this time. The persistent endoleak is likely due to additional small lumbar artery branches. Additional treatment will require direct sac puncture, but as described above, that is not recommended unless he becomes symptomatic or there is rapid growth of the sac. He will follow-up in 6 months.  Thank you for this interesting consult.  I greatly enjoyed meeting [name] and look forward to participating in their care.  A copy of this report was sent to the requesting provider on this date.  Electronically Signed: Delesa Kawa, ART A 05/16/2017, 2:41 PM   I spent a total of   15 Minutes in face to face in clinical consultation, greater than 50% of which was counseling/coordinating care for endoleak treatment.

## 2017-07-29 ENCOUNTER — Encounter: Payer: Self-pay | Admitting: Interventional Radiology

## 2017-11-05 ENCOUNTER — Other Ambulatory Visit (HOSPITAL_COMMUNITY): Payer: Self-pay | Admitting: Interventional Radiology

## 2017-11-05 ENCOUNTER — Other Ambulatory Visit: Payer: Self-pay | Admitting: Radiology

## 2017-11-05 DIAGNOSIS — IMO0001 Reserved for inherently not codable concepts without codable children: Secondary | ICD-10-CM

## 2017-11-05 DIAGNOSIS — T82330D Leakage of aortic (bifurcation) graft (replacement), subsequent encounter: Secondary | ICD-10-CM

## 2017-12-03 ENCOUNTER — Ambulatory Visit
Admission: RE | Admit: 2017-12-03 | Discharge: 2017-12-03 | Disposition: A | Discharge status: Home / Self Care | Payer: Medicare Other | Source: Ambulatory Visit | Attending: Interventional Radiology | Admitting: Interventional Radiology

## 2017-12-03 ENCOUNTER — Encounter: Payer: Self-pay | Admitting: Radiology

## 2017-12-03 DIAGNOSIS — IMO0001 Reserved for inherently not codable concepts without codable children: Secondary | ICD-10-CM

## 2017-12-03 DIAGNOSIS — T82330D Leakage of aortic (bifurcation) graft (replacement), subsequent encounter: Secondary | ICD-10-CM

## 2017-12-03 HISTORY — PX: IR RADIOLOGIST EVAL & MGMT: IMG5224

## 2017-12-03 NOTE — Progress Notes (Signed)
Chief Complaint: Patient was seen in follow-up today for  Chief Complaint  Patient presents with  . Follow-up    follow up Type 2 Endoleak   at the request of Wave Calzada  Referring Physician(s): Davarion Cuffee  History of Present Illness: Jimmy Burke is a 84 y.o. male who returns approximately one year after embolization of a type II endoleak. He is asymptomatic and feels great despite his age. He remains very active. He is still on anticoagulation for atrial fibrillation. Cardiac ablation has failed in the past therefore he is on chronic anticoagulation. Specifically, he denies any abdominal pain or nagging back pain. He denies any vascular symptoms in his extremities. He is very happy with his results.  Past Medical History:  Diagnosis Date  . Atrial fibrillation ([place])   . Chronic anticoagulation   . Endoleak post (EVAR) endovascular aneurysm repair ([place])   . Hypertension      Allergies: Patient has no known allergies.  Medications: Prior to Admission medications   Medication Sig Start Date End Date Taking? Authorizing Provider  apixaban (ELIQUIS) 2.5 MG TABS tablet Take 2.5 mg by mouth 2 (two) times daily.   Yes [provider]  aspirin 81 MG tablet Take 81 mg by mouth daily.   Yes [provider]  calcium carbonate 1250 MG capsule Take 1,250 mg by mouth 2 (two) times daily.   Yes [provider]  diltiazem (CARDIZEM) 60 MG tablet Take 60 mg by mouth 2 (two) times daily.   Yes [provider]  donepezil (ARICEPT) 5 MG tablet Take 5 mg by mouth at bedtime.   Yes [provider]  hydrochlorothiazide (HYDRODIURIL) 25 MG tablet Take 25 mg by mouth daily. Take 1/4 tablet daily.   Yes [provider]  ketoconazole (NIZORAL) 2 % cream Apply 1 application topically daily.   Yes [provider]  metoprolol (LOPRESSOR) 100 MG tablet Take 100 mg by mouth 2 (two) times daily.   Yes [provider]    simvastatin (ZOCOR) 20 MG tablet Take 20 mg by mouth daily.   Yes [provider]     No family history on file.  Social History   Socioeconomic History  . Marital status: Widowed    Spouse name: Not on file  . Number of children: Not on file  . Years of education: Not on file  . Highest education level: Not on file  Social Needs  . Financial resource strain: Not on file  . Food insecurity - worry: Not on file  . Food insecurity - inability: Not on file  . Transportation needs - medical: Not on file  . Transportation needs - non-medical: Not on file  Occupational History  . Not on file  Tobacco Use  . Smoking status: Former Research scientist (life sciences)  . Smokeless tobacco: Never Used  Substance and Sexual Activity  . Alcohol use: Yes    Alcohol/week: 0.0 oz    Comment: occasionally    . Drug use: No  . Sexual activity: Not on file  Other Topics Concern  . Not on file  Social History Narrative  . Not on file     Review of Systems: A 12 point ROS discussed and pertinent positives are indicated in the HPI above.  All other systems are negative.  Review of Systems  Vital Signs: BP (!) 152/74   Pulse 88   Temp 98.4 F (36.9 C) (Oral)   Ht 5\' 6"  (6.301 m)   Wt 124 lb (56.2  kg)   SpO2 97%   BMI 20.01 kg/m   Physical Exam  Constitutional: He is oriented to person, place, and time. He appears well-developed and well-nourished.  HENT:  Head: Normocephalic and atraumatic.  Neurological: He is alert and oriented to person, place, and time.      Imaging: Ir Radiologist Eval & Mgmt  Result Date: 12/03/2017 Please refer to notes tab for details about interventional procedure. (Op Note)  A recent CTA demonstrates a continued type II endoleak but stable aneurysm sac size with a maximal diameter of 7.2 cm area   Labs:  CBC: No results for input(s): WBC, HGB, HCT, PLT in the last 8760 hours.  COAGS: No results for input(s): INR, APTT in the last 8760 hours.  BMP: No  results for input(s): NA, K, CL, CO2, GLUCOSE, BUN, CALCIUM, CREATININE, GFRNONAA, GFRAA in the last 8760 hours.  Invalid input(s): CMP  LIVER FUNCTION TESTS: No results for input(s): BILITOT, AST, ALT, ALKPHOS, PROT, ALBUMIN in the last 8760 hours.  TUMOR MARKERS: No results for input(s): AFPTM, CEA, CA199, CHROMGRNA in the last 8760 hours.  Assessment and Plan:  Despite a continued type II endoleak, his aneurysm sac has remained stable in size. He is also asymptomatic, therefore I am recommending continued conservative management and a follow-up CTA in one year. I did offer considering repeat embolization if he strongly wished for the procedure to be performed, but he agrees that conservative management is best.  Thank you for this interesting consult.  I greatly enjoyed meeting Jimmy Burke and look forward to participating in their care.  A copy of this report was sent to the requesting provider on this date.  Electronically Signed: Bobi Daudelin, ART A 12/03/2017, 9:30 AM   I spent a total of   15 Minutes in face to face in clinical consultation, greater than 50% of which was counseling/coordinating care for type II endoleak status post aortic stent graft placement.

## 2017-12-26 IMAGING — CT CT CTA ABD/PEL W/CM AND/OR W/O CM
2 of 12 series · 12 of 46 positions shown, 17 images · IV contrast (APPLIED)
Comparison: 04/20/2016

CLINICAL DATA: F/u endoleak of aortic graft.

EXAM:
CTA ABDOMEN AND PELVIS WITH CONTRAST
TECHNIQUE: Multidetector CT imaging of the abdomen and pelvis was performed
using the standard protocol during bolus administration of
intravenous contrast. Multiplanar reconstructed images and MIPs were
obtained and reviewed to evaluate the vascular anatomy.
CONTRAST:  100 mL Isovue 370 IV

[Series 6: axial arterial · axial · arterial · 0.76mm/px · z∈[-453,-75]mm · 10 of 146 slices shown, 15 images]
[im 10/146  soft-tissue]
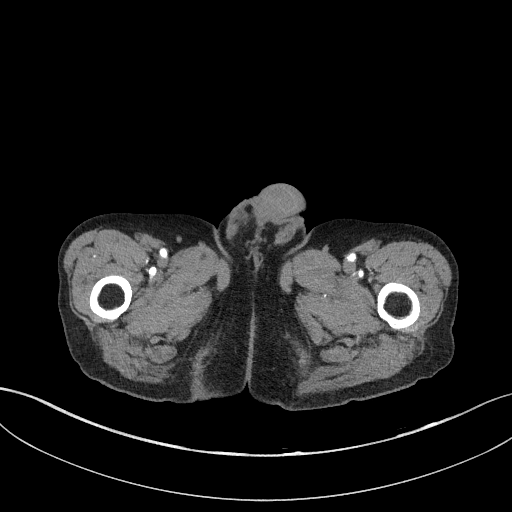
[im 10/146  bone]
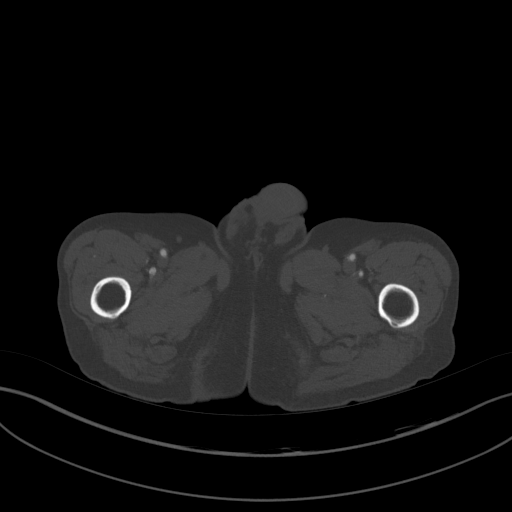
[im 30/146  soft-tissue]
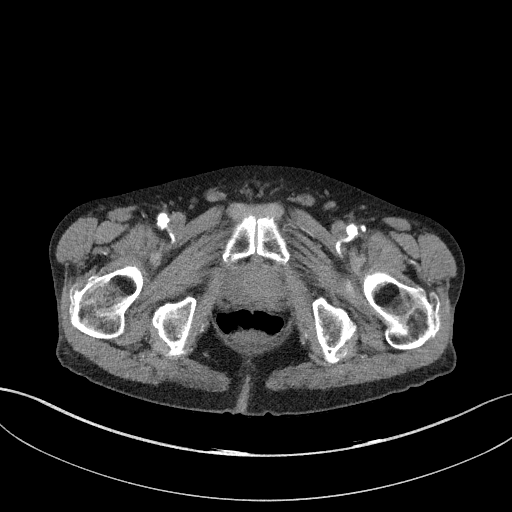
[im 39/146  soft-tissue]
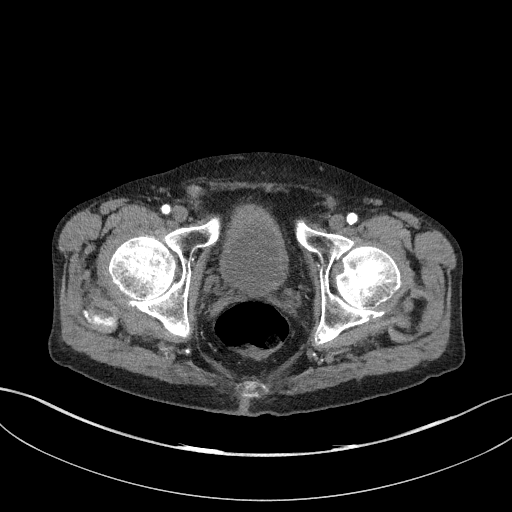
[im 59/146  soft-tissue]
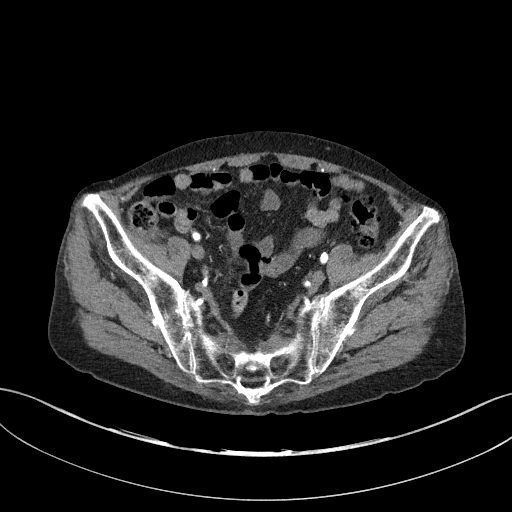
[im 78/146  soft-tissue]
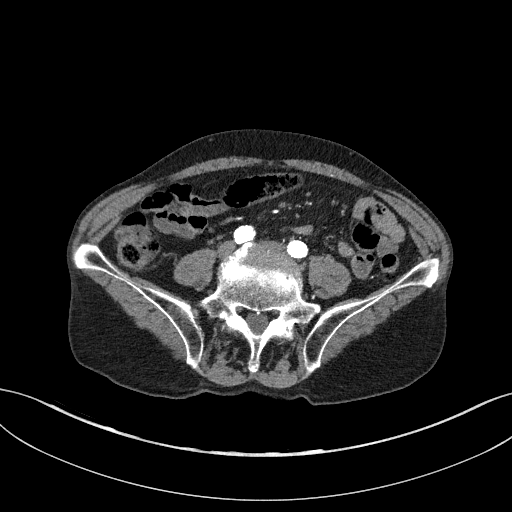
[im 88/146  soft-tissue]
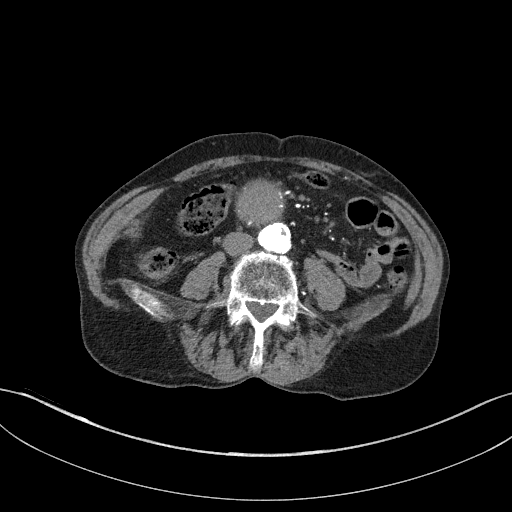
[im 107/146  soft-tissue]
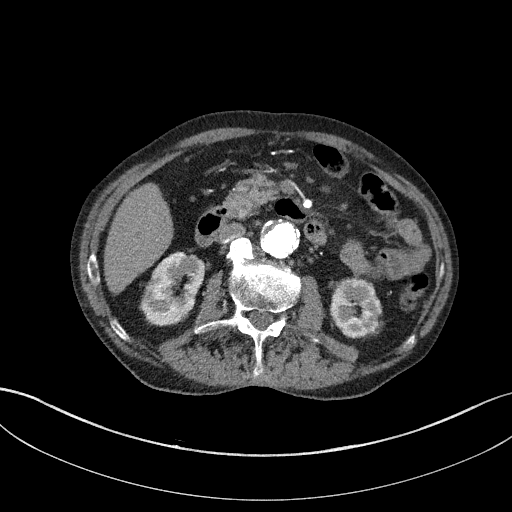
[im 107/146  lung]
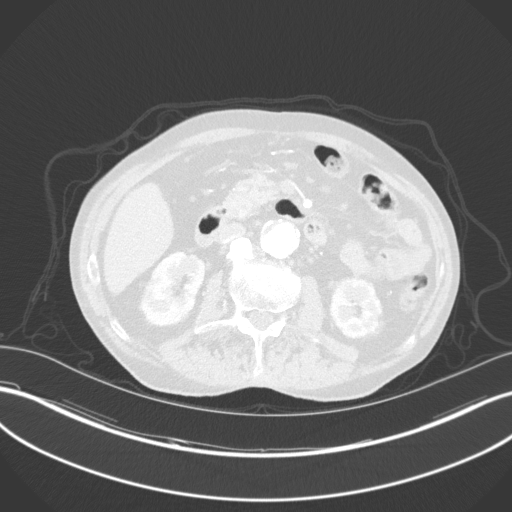
[im 117/146  soft-tissue]
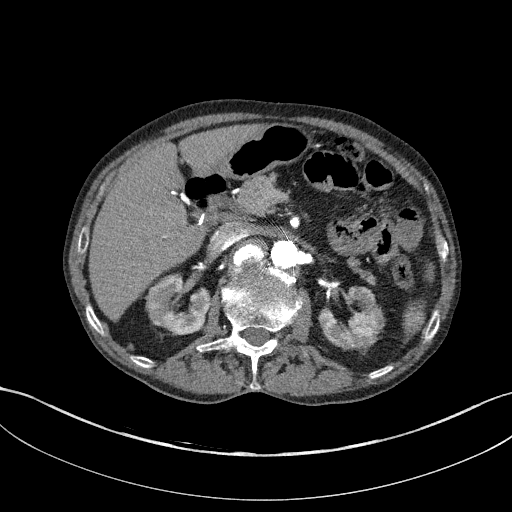
[im 117/146  lung]
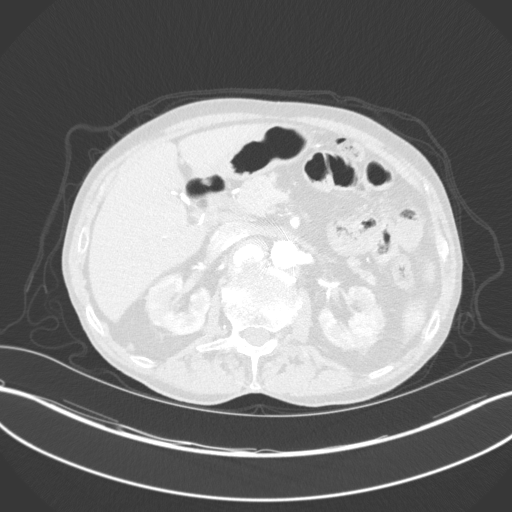
[im 126/146  lung]
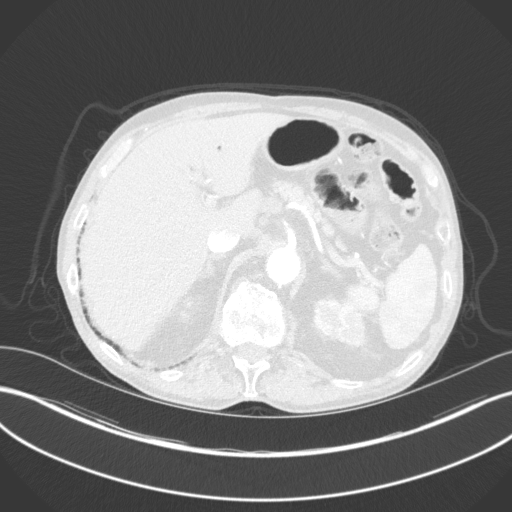
[im 136/146  soft-tissue]
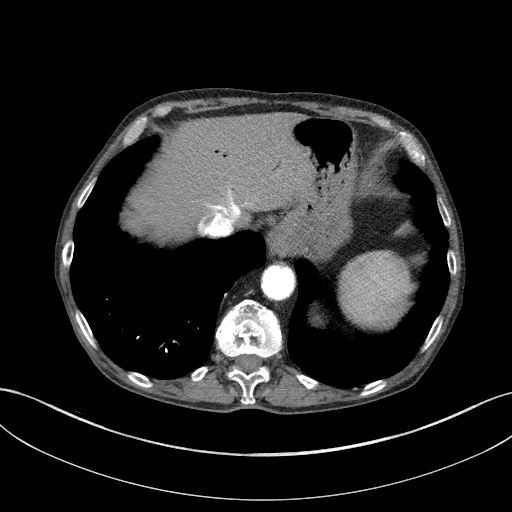
[im 136/146  lung]
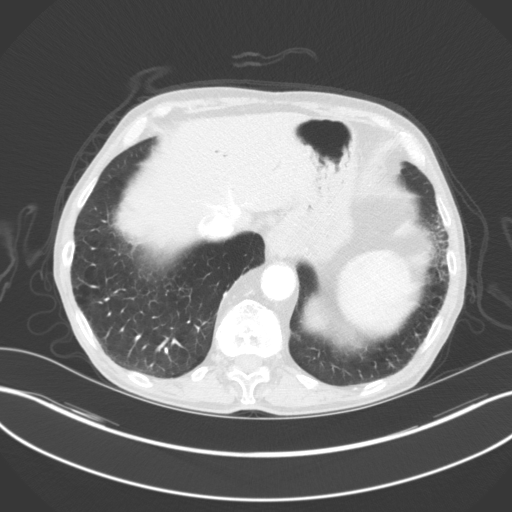
[im 136/146  bone]
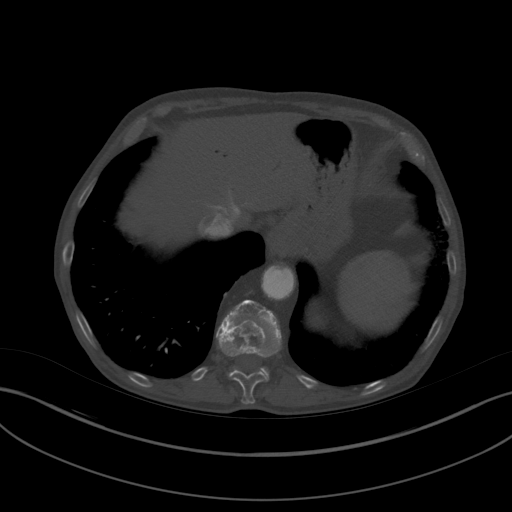

[Series 8: coronals · coronal · 0.72mm/px · 2 of 131 slices shown]
[im 44/131  soft-tissue]
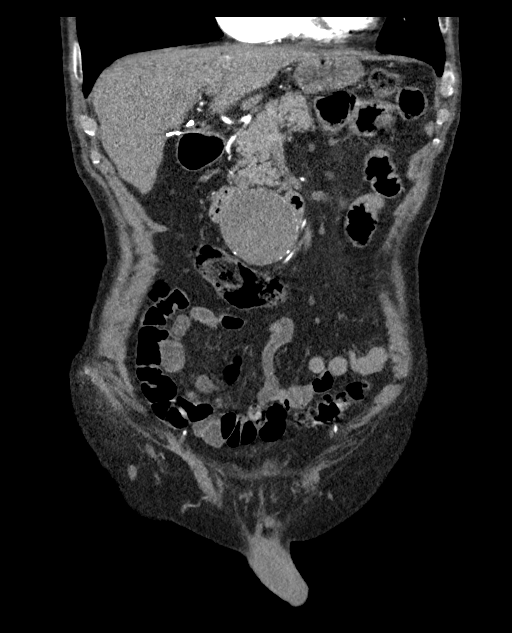
[im 87/131  soft-tissue]
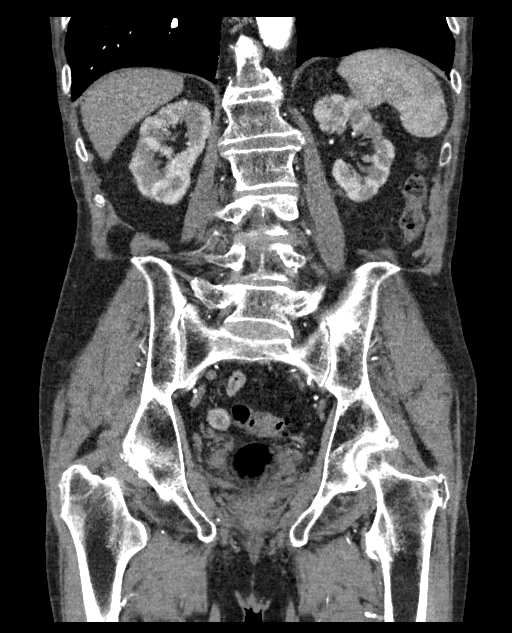

[12 of 46 positions shown; findings below may reference images not displayed]

FINDINGS: VASCULAR

Aorta: Scattered calcified plaque in the visualized distal
descending thoracic and suprarenal segments. Patent bifurcated
infrarenal stent graft with persistent endoleak probably related to
patent IMA and lumbar vessels involving much of the native aneurysm
sac. Interval increase in native sac diameter to 6.9 cm, previously
6.5.

Celiac: Patent without evidence of aneurysm, dissection, vasculitis
or significant stenosis.

SMA: Patent without evidence of aneurysm, dissection, vasculitis or
significant stenosis. Classic distal branch anatomy.

Renals: Duplicated on the left, inferior dominant. Duplicated on the
right, superior dominant. Stent tines cover the origin of the
diminutive the inferior right renal artery, without any associated
parenchymal perfusion changes. Remainder of renal arteries appear
widely patent.

IMA: Probable continued patency and association with the type 2
aortic endoleak.

Inflow: Both limbs of the stent graft are patent, extending to the
distal common iliac arteries, well apposed. Scattered calcified
plaque in the native external and internal iliac arteries without
aneurysm, dissection, or stenosis.

Proximal Outflow: Bilateral common femoral and visualized portions
of the superficial and profunda femoral arteries are patent without
evidence of aneurysm, dissection, vasculitis or significant
stenosis.

Veins: Patent hepatic veins, portal vein, SMV, splenic vein,
bilateral renal veins, IVC.

Review of the MIP images confirms the above findings.

NON-VASCULAR

Lower chest: No acute abnormality.

Hepatobiliary: Pneumobilia as before, indicating patency of a
presumed previous sphincterotomy. No focal liver lesion. Previous
cholecystectomy.

Pancreas: Unremarkable. No pancreatic ductal dilatation or
surrounding inflammatory changes.

Spleen: Normal in size without focal abnormality.

Adrenals/Urinary Tract: Normal adrenal glands. Lobular renal contour
bilaterally without hydronephrosis or focal lesion. Urinary bladder
incompletely distended.

Stomach/Bowel: Stomach, small bowel, colon nondilated. Appendix not
discretely identified. Innumerable distal descending and sigmoid
diverticula without adjacent inflammatory/edematous change

Lymphatic: No adenopathy identified.

Reproductive: Mild prostatic enlargement.

Other: No ascites.  No free air.

Musculoskeletal: Spondylitic changes throughout the lumbar spine
with large anterior spurs. Negative for fracture or worrisome bone
lesion.
IMPRESSION: VASCULAR

1. Continued type 2 endoleak about the infrarenal aortic stent
graft, with continued enlargement of the native aneurysm sac to
cm, from 6.5 on 04/20/2016.

NON-VASCULAR

1. No acute findings.
2. Descending and sigmoid diverticulosis.

## 2018-11-27 ENCOUNTER — Other Ambulatory Visit: Payer: Self-pay

## 2018-11-27 ENCOUNTER — Other Ambulatory Visit: Payer: Self-pay | Admitting: Interventional Radiology

## 2018-11-27 DIAGNOSIS — IMO0001 Reserved for inherently not codable concepts without codable children: Secondary | ICD-10-CM

## 2018-11-27 DIAGNOSIS — IMO0002 Reserved for concepts with insufficient information to code with codable children: Secondary | ICD-10-CM

## 2018-11-27 DIAGNOSIS — T82330A Leakage of aortic (bifurcation) graft (replacement), initial encounter: Secondary | ICD-10-CM

## 2018-11-27 DIAGNOSIS — T82330D Leakage of aortic (bifurcation) graft (replacement), subsequent encounter: Secondary | ICD-10-CM

## 2018-12-02 ENCOUNTER — Other Ambulatory Visit: Payer: Self-pay | Admitting: Interventional Radiology

## 2019-04-16 ENCOUNTER — Other Ambulatory Visit: Payer: Self-pay | Admitting: *Deleted

## 2019-04-16 ENCOUNTER — Other Ambulatory Visit: Payer: Self-pay | Admitting: Interventional Radiology

## 2019-04-16 DIAGNOSIS — IMO0002 Reserved for concepts with insufficient information to code with codable children: Secondary | ICD-10-CM

## 2019-04-16 DIAGNOSIS — IMO0001 Reserved for inherently not codable concepts without codable children: Secondary | ICD-10-CM

## 2019-04-16 DIAGNOSIS — T82330A Leakage of aortic (bifurcation) graft (replacement), initial encounter: Secondary | ICD-10-CM

## 2019-04-16 DIAGNOSIS — T82330D Leakage of aortic (bifurcation) graft (replacement), subsequent encounter: Secondary | ICD-10-CM

## 2019-05-04 ENCOUNTER — Encounter: Payer: Self-pay | Admitting: *Deleted

## 2019-05-04 ENCOUNTER — Other Ambulatory Visit: Payer: Self-pay | Admitting: *Deleted

## 2019-05-04 ENCOUNTER — Other Ambulatory Visit: Payer: Self-pay

## 2019-05-04 ENCOUNTER — Ambulatory Visit
Admission: RE | Admit: 2019-05-04 | Discharge: 2019-05-04 | Disposition: A | Discharge status: Home / Self Care | Payer: Medicare Other | Source: Ambulatory Visit | Attending: Interventional Radiology | Admitting: Interventional Radiology

## 2019-05-04 DIAGNOSIS — T82330D Leakage of aortic (bifurcation) graft (replacement), subsequent encounter: Secondary | ICD-10-CM

## 2019-05-04 DIAGNOSIS — IMO0001 Reserved for inherently not codable concepts without codable children: Secondary | ICD-10-CM

## 2019-05-04 HISTORY — PX: IR RADIOLOGIST EVAL & MGMT: IMG5224

## 2019-05-04 NOTE — Progress Notes (Signed)
Chief Complaint: Type 2 Endoleak, SP Endovascular Repair AAA  Referring Physician(s): Dr. Barbie Banner  PCP: Dr. Blenda Mounts [phone]  Cardiology: Dr. Abran Richard [phone]  History of Present Illness: Jimmy Burke is a 85 y.o. 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 Denyce Burke in [place], circa 2015.    Jimmy Burke and I spoke via telephone today given the current COVID-19 pandemic, and his daughter Jimmy Burke was also on the call.   Jimmy Burke was previously followed by my VIR partner, the late Dr. Marybelle Killings.  He was last seen in our clinic 1 year ago, 12/03/2017. This was a surveillance appointment after our team treated him for his Type II endoleak and enlarging aneurysm sac, with transarterial embolization of the lumbar arteries performed at [facility] 11/27/2016.    Thereafter, the aneurysm sac has stayed relatively similar in size from CT to CT, however, when compared to the most recent CT, today, to the most remote pre-endograft repair which is 02/01/2014, there has been enlargement from about 5.6cm to about 7.9cm.    He tells me that he has had no interval hospitalizations, although was treated in the ED for a fall and facial injury.  He denies any history of MI or stroke.  He denies any chest pain with exertion, resting chest pain, or DOE.    He does report that his only ambulation is around his house, and to the mailbox.  If he ambulates long distance, he has fatigue in his legs.  He denies any resting pain in his legs.    He denies any new fever, cough, SOB, or GI symptoms.    He is a widower, and lives with his daughter.  He has 1 grandson, and 2 of his own dogs that he cares for.   [employer] cell: [phone] Jimmy Errico home number: [phone]  Past Medical History:  Diagnosis Date  . Atrial fibrillation ([place])   . Chronic anticoagulation   . Endoleak post ([facility])  endovascular aneurysm repair ([place])   . Hypertension    Allergies: Patient has no known allergies.  Medications: Prior to Admission medications   Medication Sig Start Date End Date Taking? Authorizing Provider  apixaban (ELIQUIS) 2.5 MG TABS tablet Take 2.5 mg by mouth 2 (two) times daily.    [provider]  aspirin 81 MG tablet Take 81 mg by mouth daily.    [provider]  calcium carbonate 1250 MG capsule Take 1,250 mg by mouth 2 (two) times daily.    [provider]  diltiazem (CARDIZEM) 60 MG tablet Take 60 mg by mouth 2 (two) times daily.    [provider]  donepezil (ARICEPT) 5 MG tablet Take 5 mg by mouth at bedtime.    [provider]  hydrochlorothiazide (HYDRODIURIL) 25 MG tablet Take 25 mg by mouth daily. Take 1/4 tablet daily.    [provider]  ketoconazole (NIZORAL) 2 % cream Apply 1 application topically daily.    [provider]  metoprolol (LOPRESSOR) 100 MG tablet Take 100 mg by mouth 2 (two) times daily.    [provider]  simvastatin (ZOCOR) 20 MG tablet Take 20 mg by mouth daily.    [provider]     No family history on file.  Social History   Socioeconomic History  . Marital status: Widowed    Spouse name: Not on file  .  Number of children: Not on file  . Years of education: Not on file  . Highest education level: Not on file  Occupational History  . Not on file  Social Needs  . Financial resource strain: Not on file  . Food insecurity    Worry: Not on file    Inability: Not on file  . Transportation needs    Medical: Not on file    Non-medical: Not on file  Tobacco Use  . Smoking status: Former Research scientist (life sciences)  . Smokeless tobacco: Never Used  Substance and Sexual Activity  . Alcohol use: Yes    Alcohol/week: 0.0 standard drinks    Comment: occasionally    . Drug use: No  . Sexual activity: Not on file  Lifestyle  . Physical activity    Days per week: Not on file     Minutes per session: Not on file  . Stress: Not on file  Relationships  . Social Herbalist on phone: Not on file    Gets together: Not on file    Attends religious service: Not on file    Active member of club or organization: Not on file    Attends meetings of clubs or organizations: Not on file    Relationship status: Not on file  Other Topics Concern  . Not on file  Social History Narrative  . Not on file     Review of Systems  Review of Systems: A 12 point ROS discussed and pertinent positives are indicated in the HPI above.  All other systems are negative.  Physical Exam No direct physical exam was performed (except for noted visual exam findings with Video Visits).    Vital Signs: There were no vitals taken for this visit.  Imaging: No results found.  Labs:  CBC: No results for input(s): WBC, HGB, HCT, PLT in the last 8760 hours.  COAGS: No results for input(s): INR, APTT in the last 8760 hours.  BMP: No results for input(s): NA, K, CL, CO2, GLUCOSE, BUN, CALCIUM, CREATININE, GFRNONAA, GFRAA in the last 8760 hours.  Invalid input(s): CMP  LIVER FUNCTION TESTS: No results for input(s): BILITOT, AST, ALT, ALKPHOS, PROT, ALBUMIN in the last 8760 hours.  TUMOR MARKERS: No results for input(s): AFPTM, CEA, CA199, CHROMGRNA in the last 8760 hours.  Assessment and Plan:  Jimmy Wandrey is an 85 year old male with persisting type II endoleak with enlarging AAA sac SP endovascular stent-graft repair, and prior trans-arterial embolization of lumbar arteries.    The aneurysm has maximum diameter of 7.9cm today on CT, with the pre-operative CT in 2015 showing 5.6cm as the largest diameter.  Clearly he has progressing growth and is at risk for late rupture.    I had a lengthy discussion with Jimmy Burke and his daughter today via phone regarding the anatomy, pathology/pathophysiology, the treatment options, and risk/benefit analysis of another intervention for  Endoleak repair.  I reviewed the concepts of AAA exclusion with endograft as alternative to open surgery, specifically with the generally accepted drawback of more interventions long-term with endograft repair.  Additionally, we discussed possible options for intervention to address the endoleak, which include possible transarterial, trans-venous, or direct puncture as solutions for embolization.  I was clear that there may be a need for combination in his case given the appearance of the perfusing sac.   I reviewed risk benefit, with specific risks to include: bleeding, infection, arterial injury, kidney injury, organ injury, need for further surgery/intervention,  need for hospitalization, contrast reaction, limb loss, non-target embolization, risk of anesthesia, cardiopulmonary collapse, death.   Jimmy Lipari understands the concepts of treatment, and expresses to me that he wants to do everything possible to treat the leak and decrease the risk of late rupture.  He is very motivated to proceed.    I answered all of their questions, with the plan to proceed.   Plan: - Plan to proceed with aortic/pelvic angiogram under general anesthesia, possible intervention, for treatment of persisting Type 2 Endoleak and enlarging AAA, with Dr. Earleen Newport - Possible trans-venous aortic sac access, requiring IVUS, with possible percutaneous puncture of the sac using cone-beam CT.  - He has Afib with eliquis, and will need to either be withdrawn totally from anticoagulation before his treatment, or bridged with lovenox.  We will need to reach out to his Cardiology team to ask, Dr. Otho Perl, number is above.  - I have encouraged him to follow up with his other physicians on schedule.    Electronically Signed: Corrie Mckusick 05/04/2019, 2:31 PM   I spent a total of    40 Minutes in remote  clinical consultation, greater than 50% of which was counseling/coordinating care for Type 2 Endoleak, with enlarging AAA sac, possible  intervention for embolization.    Visit type: Audio only (telephone). Audio (no video) only due to no video access. Alternative for in-person consultation at [facility], [address] [place], [place], [place]. This visit type was conducted due to national recommendations for restrictions regarding the COVID-19 Pandemic (e.g. social distancing).  This format is felt to be most appropriate for this patient at this time.  All issues noted in this document were discussed and addressed.

## 2019-05-05 ENCOUNTER — Other Ambulatory Visit (HOSPITAL_COMMUNITY): Payer: Self-pay | Admitting: Interventional Radiology

## 2019-05-05 DIAGNOSIS — I714 Abdominal aortic aneurysm, without rupture, unspecified: Secondary | ICD-10-CM

## 2019-05-09 ENCOUNTER — Other Ambulatory Visit (HOSPITAL_COMMUNITY)
Admission: RE | Admit: 2019-05-09 | Discharge: 2019-05-09 | Disposition: A | Discharge status: Home / Self Care | Payer: Medicare Other | Source: Ambulatory Visit | Attending: Interventional Radiology | Admitting: Interventional Radiology

## 2019-05-09 DIAGNOSIS — Z1159 Encounter for screening for other viral diseases: Secondary | ICD-10-CM | POA: Diagnosis present

## 2019-05-09 LAB — SARS CORONAVIRUS 2 (TAT 6-24 HRS): SARS Coronavirus 2: NEGATIVE

## 2019-05-12 ENCOUNTER — Other Ambulatory Visit: Payer: Self-pay | Admitting: Radiology

## 2019-05-12 ENCOUNTER — Encounter (HOSPITAL_COMMUNITY): Payer: Self-pay | Admitting: Anesthesiology

## 2019-05-12 ENCOUNTER — Other Ambulatory Visit: Payer: Self-pay

## 2019-05-12 NOTE — Progress Notes (Signed)
Pt denies SOB and chest pain. Pt stated that he is under the care of  Dr. Otho Perl, Cardiologist. Pt denies having a cardiac cath and stated that a stress test was performed " 10 years ago. " Pt denies having a chest x ray and EKG within the last year. Pt denies recent labs. Pt stated that his last dose of Eliquis was "Saturday" as instructed. Pt stated that he was not provided with pre-op instructions regarding Aspirin. Pt stated " I am not taking my Aspirin tomorrow." Nurse made Dr. Gifford Shave, Anesthesiologist, aware of order for anesthesia consult; no new orders given- " okay. "   Pt denies that he and family members tested positive for COVID-19; pt tested on 05/09/19 and reminded to quarantine.  Coronavirus Screening  Pt denies that he and family members experienced the following symptoms:  Cough yes/no: No Fever (>100.53F)  yes/no: No Runny nose yes/no: No Sore throat yes/no: No Difficulty breathing/shortness of breath  yes/no: No  Have you or a family member traveled in the last 14 days and where? yes/no: No  Pt reminded that hospital visitation restrictions are in effect and the importance of the restrictions.   Pt verbalized understanding of all pre-op instructions.

## 2019-05-13 ENCOUNTER — Ambulatory Visit (HOSPITAL_COMMUNITY): Payer: Medicare Other | Admitting: Anesthesiology

## 2019-05-13 ENCOUNTER — Encounter (HOSPITAL_COMMUNITY): Payer: Self-pay

## 2019-05-13 ENCOUNTER — Ambulatory Visit (HOSPITAL_COMMUNITY)
Admission: RE | Admit: 2019-05-13 | Discharge: 2019-05-14 | Disposition: A | Discharge status: Home / Self Care | Payer: Medicare Other | Attending: Interventional Radiology | Admitting: Interventional Radiology

## 2019-05-13 ENCOUNTER — Ambulatory Visit (HOSPITAL_COMMUNITY)
Admission: RE | Admit: 2019-05-13 | Discharge: 2019-05-13 | Disposition: A | Discharge status: Home / Self Care | Payer: Medicare Other | Source: Ambulatory Visit | Attending: Interventional Radiology | Admitting: Interventional Radiology

## 2019-05-13 ENCOUNTER — Encounter (HOSPITAL_COMMUNITY)
Admission: RE | Disposition: A | Discharge status: Home / Self Care | Payer: Self-pay | Source: Home / Self Care | Attending: Interventional Radiology

## 2019-05-13 DIAGNOSIS — Z8679 Personal history of other diseases of the circulatory system: Secondary | ICD-10-CM | POA: Insufficient documentation

## 2019-05-13 DIAGNOSIS — Z7901 Long term (current) use of anticoagulants: Secondary | ICD-10-CM | POA: Insufficient documentation

## 2019-05-13 DIAGNOSIS — Z7982 Long term (current) use of aspirin: Secondary | ICD-10-CM | POA: Insufficient documentation

## 2019-05-13 DIAGNOSIS — T82330A Leakage of aortic (bifurcation) graft (replacement), initial encounter: Secondary | ICD-10-CM | POA: Diagnosis present

## 2019-05-13 DIAGNOSIS — I714 Abdominal aortic aneurysm, without rupture, unspecified: Secondary | ICD-10-CM

## 2019-05-13 DIAGNOSIS — IMO0002 Reserved for concepts with insufficient information to code with codable children: Secondary | ICD-10-CM | POA: Diagnosis present

## 2019-05-13 DIAGNOSIS — Z79899 Other long term (current) drug therapy: Secondary | ICD-10-CM | POA: Insufficient documentation

## 2019-05-13 DIAGNOSIS — Z87891 Personal history of nicotine dependence: Secondary | ICD-10-CM | POA: Diagnosis not present

## 2019-05-13 DIAGNOSIS — I10 Essential (primary) hypertension: Secondary | ICD-10-CM | POA: Insufficient documentation

## 2019-05-13 DIAGNOSIS — I4891 Unspecified atrial fibrillation: Secondary | ICD-10-CM | POA: Insufficient documentation

## 2019-05-13 HISTORY — DX: Presence of dental prosthetic device (complete) (partial): Z97.2

## 2019-05-13 HISTORY — DX: Complete loss of teeth, unspecified cause, unspecified class: K08.109

## 2019-05-13 HISTORY — PX: IR AORTAGRAM ABDOMINAL SERIALOGRAM: IMG636

## 2019-05-13 HISTORY — PX: IR CT SPINE LTD: IMG2387

## 2019-05-13 HISTORY — PX: IR EMBO ARTERIAL NOT HEMORR HEMANG INC GUIDE ROADMAPPING: IMG5448

## 2019-05-13 HISTORY — PX: IR VENOCAVAGRAM IVC: IMG678

## 2019-05-13 HISTORY — PX: IR INTRAVASCULAR ULTRASOUND NON CORONARY: IMG6085

## 2019-05-13 HISTORY — PX: IR US GUIDE VASC ACCESS RIGHT: IMG2390

## 2019-05-13 HISTORY — PX: RADIOLOGY WITH ANESTHESIA: SHX6223

## 2019-05-13 LAB — CBC WITH DIFFERENTIAL/PLATELET
Abs Immature Granulocytes: 0.03 10*3/uL (ref 0.00–0.07)
Basophils Absolute: 0 10*3/uL (ref 0.0–0.1)
Basophils Relative: 1 %
Eosinophils Absolute: 0.2 10*3/uL (ref 0.0–0.5)
Eosinophils Relative: 3 %
HCT: 43.1 % (ref 39.0–52.0)
Hemoglobin: 14.8 g/dL (ref 13.0–17.0)
Immature Granulocytes: 1 %
Lymphocytes Relative: 23 %
Lymphs Abs: 1.4 10*3/uL (ref 0.7–4.0)
MCH: 35.9 pg — ABNORMAL HIGH (ref 26.0–34.0)
MCHC: 34.3 g/dL (ref 30.0–36.0)
MCV: 104.6 fL — ABNORMAL HIGH (ref 80.0–100.0)
Monocytes Absolute: 0.7 10*3/uL (ref 0.1–1.0)
Monocytes Relative: 11 %
Neutro Abs: 3.9 10*3/uL (ref 1.7–7.7)
Neutrophils Relative %: 61 %
Platelets: 203 10*3/uL (ref 150–400)
RBC: 4.12 MIL/uL — ABNORMAL LOW (ref 4.22–5.81)
RDW: 11.9 % (ref 11.5–15.5)
WBC: 6.3 10*3/uL (ref 4.0–10.5)
nRBC: 0 % (ref 0.0–0.2)

## 2019-05-13 LAB — BASIC METABOLIC PANEL
Anion gap: 10 (ref 5–15)
BUN: 17 mg/dL (ref 8–23)
CO2: 26 mmol/L (ref 22–32)
Calcium: 9.9 mg/dL (ref 8.9–10.3)
Chloride: 103 mmol/L (ref 98–111)
Creatinine, Ser: 1.29 mg/dL — ABNORMAL HIGH (ref 0.61–1.24)
GFR calc Af Amer: 59 mL/min — ABNORMAL LOW (ref >60)
GFR calc non Af Amer: 51 mL/min — ABNORMAL LOW (ref >60)
Glucose, Bld: 103 mg/dL — ABNORMAL HIGH (ref 70–99)
Potassium: 4.1 mmol/L (ref 3.5–5.1)
Sodium: 139 mmol/L (ref 135–145)

## 2019-05-13 LAB — TYPE AND SCREEN
ABO/RH(D): A POS
Antibody Screen: NEGATIVE

## 2019-05-13 LAB — PROTIME-INR
INR: 1.1 (ref 0.8–1.2)
Prothrombin Time: 13.8 seconds (ref 11.4–15.2)

## 2019-05-13 LAB — ABO/RH: ABO/RH(D): A POS

## 2019-05-13 SURGERY — IR WITH ANESTHESIA
Anesthesia: General

## 2019-05-13 MED ORDER — CEFAZOLIN SODIUM-DEXTROSE 2-4 GM/100ML-% IV SOLN
2.0000 g | INTRAVENOUS | Status: AC
  Administered 2019-05-13: 2 g via INTRAVENOUS
  Filled 2019-05-13: qty 100

## 2019-05-13 MED ORDER — ROCURONIUM BROMIDE 10 MG/ML (PF) SYRINGE
PREFILLED_SYRINGE | INTRAVENOUS | Status: DC | PRN
  Administered 2019-05-13 (×3): 20 mg via INTRAVENOUS
  Administered 2019-05-13: 60 mg via INTRAVENOUS

## 2019-05-13 MED ORDER — LACTATED RINGERS IV SOLN
INTRAVENOUS | Status: DC | PRN
  Administered 2019-05-13: 07:00:00 via INTRAVENOUS

## 2019-05-13 MED ORDER — ONDANSETRON HCL 4 MG/2ML IJ SOLN
INTRAMUSCULAR | Status: DC | PRN
  Administered 2019-05-13: 4 mg via INTRAVENOUS

## 2019-05-13 MED ORDER — IOHEXOL 300 MG/ML  SOLN
150.0000 mL | Freq: Once | INTRAMUSCULAR | Status: AC | PRN
  Administered 2019-05-13: 12:00:00 75 mL via INTRAVENOUS

## 2019-05-13 MED ORDER — SIMVASTATIN 20 MG PO TABS
20.0000 mg | ORAL_TABLET | Freq: Every day | ORAL | Status: DC
  Administered 2019-05-14: 20 mg via ORAL
  Filled 2019-05-13: qty 1

## 2019-05-13 MED ORDER — SODIUM CHLORIDE 0.9 % IV SOLN
INTRAVENOUS | Status: DC | PRN
  Administered 2019-05-13: 20 ug/min via INTRAVENOUS

## 2019-05-13 MED ORDER — FENTANYL CITRATE (PF) 250 MCG/5ML IJ SOLN
INTRAMUSCULAR | Status: DC | PRN
  Administered 2019-05-13 (×2): 50 ug via INTRAVENOUS

## 2019-05-13 MED ORDER — DEXAMETHASONE SODIUM PHOSPHATE 10 MG/ML IJ SOLN
INTRAMUSCULAR | Status: DC | PRN
  Administered 2019-05-13: 4 mg via INTRAVENOUS

## 2019-05-13 MED ORDER — THROMBIN 5000 UNITS EX SOLR
5000.0000 [IU] | Freq: Once | CUTANEOUS | Status: DC
  Filled 2019-05-13 (×2): qty 5000

## 2019-05-13 MED ORDER — PROPOFOL 10 MG/ML IV BOLUS
INTRAVENOUS | Status: DC | PRN
  Administered 2019-05-13: 100 mg via INTRAVENOUS

## 2019-05-13 MED ORDER — LIDOCAINE 2% (20 MG/ML) 5 ML SYRINGE
INTRAMUSCULAR | Status: DC | PRN
  Administered 2019-05-13: 100 mg via INTRAVENOUS

## 2019-05-13 MED ORDER — DONEPEZIL HCL 5 MG PO TABS
5.0000 mg | ORAL_TABLET | Freq: Every day | ORAL | Status: DC
  Administered 2019-05-14: 5 mg via ORAL
  Filled 2019-05-13: qty 1

## 2019-05-13 MED ORDER — FENTANYL CITRATE (PF) 100 MCG/2ML IJ SOLN
INTRAMUSCULAR | Status: AC
  Filled 2019-05-13: qty 2

## 2019-05-13 MED ORDER — SUGAMMADEX SODIUM 200 MG/2ML IV SOLN
INTRAVENOUS | Status: DC | PRN
  Administered 2019-05-13: 112.4 mg via INTRAVENOUS

## 2019-05-13 MED ORDER — ASPIRIN 81 MG PO CHEW
81.0000 mg | CHEWABLE_TABLET | Freq: Every day | ORAL | Status: DC
  Administered 2019-05-13 – 2019-05-14 (×2): 81 mg via ORAL
  Filled 2019-05-13 (×2): qty 1

## 2019-05-13 MED ORDER — GLYCOPYRROLATE PF 0.2 MG/ML IJ SOSY
PREFILLED_SYRINGE | INTRAMUSCULAR | Status: DC | PRN
  Administered 2019-05-13: .2 mg via INTRAVENOUS

## 2019-05-13 MED ORDER — MIDAZOLAM HCL 2 MG/2ML IJ SOLN
INTRAMUSCULAR | Status: AC
  Filled 2019-05-13: qty 2

## 2019-05-13 MED ORDER — DILTIAZEM HCL ER COATED BEADS 120 MG PO CP24
120.0000 mg | ORAL_CAPSULE | Freq: Every day | ORAL | Status: DC
  Administered 2019-05-14: 120 mg via ORAL
  Filled 2019-05-13: qty 1

## 2019-05-13 MED ORDER — THROMBIN 5000 UNITS EX SOLR
CUTANEOUS | Status: AC | PRN
  Administered 2019-05-13 (×2): 1000 [IU] via TOPICAL

## 2019-05-13 MED ORDER — METOPROLOL SUCCINATE ER 100 MG PO TB24
100.0000 mg | ORAL_TABLET | Freq: Every day | ORAL | Status: DC
  Administered 2019-05-14: 100 mg via ORAL
  Filled 2019-05-13: qty 1

## 2019-05-13 MED ORDER — PHENYLEPHRINE HCL (PRESSORS) 10 MG/ML IV SOLN
INTRAVENOUS | Status: DC | PRN
  Administered 2019-05-13: 80 ug via INTRAVENOUS

## 2019-05-13 NOTE — Anesthesia Procedure Notes (Signed)
Procedure Name: Intubation Date/Time: 05/13/2019 8:39 AM Performed by: Renato Shin, CRNA Pre-anesthesia Checklist: Patient identified, Emergency Drugs available, Suction available and Patient being monitored Patient Re-evaluated:Patient Re-evaluated prior to induction Oxygen Delivery Method: Circle system utilized Preoxygenation: Pre-oxygenation with 100% oxygen Induction Type: IV induction Ventilation: Mask ventilation without difficulty Laryngoscope Size: Miller and 3 Grade View: Grade I Tube type: Oral Tube size: 7.5 mm Number of attempts: 1 Airway Equipment and Method: Stylet and Oral airway Placement Confirmation: ETT inserted through vocal cords under direct vision,  positive ETCO2 and breath sounds checked- equal and bilateral Secured at: 21 cm Tube secured with: Tape Dental Injury: Teeth and Oropharynx as per pre-operative assessment

## 2019-05-13 NOTE — Anesthesia Preprocedure Evaluation (Signed)
Anesthesia Evaluation  Patient identified by MRN, date of birth, ID band Patient awake    Reviewed: Allergy & Precautions, NPO status , Patient's Chart, lab work & pertinent test results  Airway Mallampati: I  TM Distance: >3 FB Neck ROM: Full    Dental   Pulmonary former smoker,    Pulmonary exam normal        Cardiovascular hypertension, Pt. on medications Normal cardiovascular exam     Neuro/Psych    GI/Hepatic   Endo/Other    Renal/GU      Musculoskeletal   Abdominal   Peds  Hematology   Anesthesia Other Findings   Reproductive/Obstetrics                             Anesthesia Physical Anesthesia Plan  ASA: III  Anesthesia Plan: General   Post-op Pain Management:    Induction: Intravenous  PONV Risk Score and Plan: 2 and Ondansetron and Treatment may vary due to age or medical condition  Airway Management Planned: Oral ETT  Additional Equipment: Arterial line  Intra-op Plan:   Post-operative Plan: Extubation in OR  Informed Consent: I have reviewed the patients History and Physical, chart, labs and discussed the procedure including the risks, benefits and alternatives for the proposed anesthesia with the patient or authorized representative who has indicated his/her understanding and acceptance.       Plan Discussed with: CRNA and Surgeon  Anesthesia Plan Comments:         Anesthesia Quick Evaluation

## 2019-05-13 NOTE — Transfer of Care (Signed)
Immediate Anesthesia Transfer of Care Note  Patient: Armel Rabbani  Procedure(s) Performed: TYPE 2 ENDOLEAK (N/A )  Patient Location: PACU  Anesthesia Type:General  Level of Consciousness: awake, alert  and patient cooperative  Airway & Oxygen Therapy: Patient Spontanous Breathing and Patient connected to nasal cannula oxygen  Post-op Assessment: Report given to RN and Post -op Vital signs reviewed and stable  Post vital signs: Reviewed and stable  Last Vitals:  Vitals Value Taken Time  BP 89/55 05/13/19 1213  Temp    Pulse 77 05/13/19 1215  Resp 17 05/13/19 1215  SpO2 100 % 05/13/19 1215  Vitals shown include unvalidated device data.  Last Pain:  Vitals:   05/13/19 0748  PainSc: 0-No pain         Complications: No apparent anesthesia complications

## 2019-05-13 NOTE — Procedures (Signed)
Interventional Radiology Procedure Note  Procedure:   GETA US guided right IJ vein access US guided right saphenofemoral vein IVUS of IVC, IVC cavogram Trans-caval access of the aneurysm sac Trans-abdominal access of the aneurysm sac Combination coil and bovine thrombin embolization of the aneurysm sac  Findings:  Large flow channel within the aneurysm sac, coil/thrombin embolized. .  Complications: None  Recommendations:  - 23 hour observation, post anesthesia - Bladder scan urinary bladder as needed - Routine wound care at the right IJ, right femoral vein, and abdominal puncture site. - Do not submerge for 7 days - Flat in bed 2 hours.  - advance diet as tolerated - ambulation as tolerated after the 2 hours - follow up with Dr. Earleen Newport in clinic in 4-8 weeks.    Signed,  Dulcy Fanny. Earleen Newport, DO

## 2019-05-13 NOTE — H&P (Signed)
Chief Complaint: Patient was seen in consultation today for Type II Endoleak repair  Referring Physician(s): Warson Woods  Supervising Physician: Corrie Mckusick  Patient Status: [facility] - Out-pt  History of Present Illness: Jimmy Burke is a 85 y.o. 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 Denyce Robert (2015) who presents today for planned image guided venogram/possible angiogram with intervention under general anesthesia. Mr. Kerth has been followed by our service since 2017, previously by Dr. Barbie Banner, now followed by Dr. Earleen Newport. He underwent transarterial embolization of the lumbar arteries on 11/27/16 with Dr. Barbie Banner. His most recent follow up appointment was conducted via phone 05/04/19 due to COVID precautions - during this appointment further intervention was thoroughly discussed with the patient and his daughter and decision was made to proceed with intervention for which he presents today. Please see Dr. Pasty Arch consult note for full details.  Patient laying in bed while CRNA placing arterial line in right wrist. He denies any questions or complaints currently. He states that he is here to "fix my leaky blood vessels that doctor messed up years ago" and that he is aware this will be done under general anesthesia. He initially declined to review the risks of the procedure stating, "I trust all of the doctors and I don't need to know all of that" however after further discussion he agreed to review the indications and risks of this procedure. He states understanding of requested procedure and wishes to proceed.   Past Medical History:  Diagnosis Date   Atrial fibrillation (HCC)    Chronic anticoagulation    Endoleak post (EVAR) endovascular aneurysm repair ([place])    Full dentures    Hypertension     Past Surgical History:  Procedure Laterality Date   ABDOMINAL AORTIC ANEURYSM REPAIR     CHOLECYSTECTOMY      EYE SURGERY     Bilateral cataract surgery     HERNIA REPAIR Right    inguinal hernia   IR GENERIC HISTORICAL  05/02/2016   IR RADIOLOGIST EVAL & MGMT 05/02/2016 Marybelle Killings, MD [facility] INTERV RAD   IR GENERIC HISTORICAL  11/15/2016   IR RADIOLOGIST EVAL & MGMT 11/15/2016 Marybelle Killings, MD [facility] INTERV RAD   IR RADIOLOGIST EVAL & MGMT  02/06/2017   IR RADIOLOGIST EVAL & MGMT  05/16/2017   IR RADIOLOGIST EVAL & MGMT  12/03/2017   IR RADIOLOGIST EVAL & MGMT  05/04/2019   MULTIPLE TOOTH EXTRACTIONS      Allergies: Patient has no known allergies.  Medications: Prior to Admission medications   Medication Sig Start Date End Date Taking? Authorizing Provider  apixaban (ELIQUIS) 5 MG TABS tablet Take 5 mg by mouth daily.    [provider]  aspirin 81 MG tablet Take 81 mg by mouth daily.    [provider]  cholecalciferol (VITAMIN D3) 25 MCG (1000 UT) tablet Take 1,000 Units by mouth daily.    [provider]  desonide (DESOWEN) 0.05 % cream Apply 1 application topically daily as needed (irritation, dry skin).    [provider]  diltiazem (CARDIZEM CD) 120 MG 24 hr capsule Take 120 mg by mouth daily. 04/28/19   [provider]  donepezil (ARICEPT) 5 MG tablet Take 5 mg by mouth daily.     [provider]  ketoconazole (NIZORAL) 2 % cream Apply 1 application topically daily as needed for irritation (dry skin).  [provider]  metoprolol succinate (TOPROL-XL) 100 MG 24 hr tablet Take 100 mg by mouth daily. Take with or immediately following a meal.    [provider]  Multiple Vitamin (MULTIVITAMIN WITH MINERALS) TABS tablet Take 1 tablet by mouth daily.    [provider]  simvastatin (ZOCOR) 20 MG tablet Take 20 mg by mouth daily.    [provider]     No family history on file.  Social History   Socioeconomic History   Marital status: Widowed    Spouse name: Not on file   Number of  children: Not on file   Years of education: Not on file   Highest education level: Not on file  Occupational History   Not on file  Social Needs   Financial resource strain: Not on file   Food insecurity    Worry: Not on file    Inability: Not on file   Transportation needs    Medical: Not on file    Non-medical: Not on file  Tobacco Use   Smoking status: Former Smoker    Types: Cigarettes   Smokeless tobacco: Never Used   Tobacco comment: quit smoking cigarettes in 1990's  Substance and Sexual Activity   Alcohol use: Not Currently    Alcohol/week: 0.0 standard drinks   Drug use: No   Sexual activity: Not on file  Lifestyle   Physical activity    Days per week: Not on file    Minutes per session: Not on file   Stress: Not on file  Relationships   Social connections    Talks on phone: Not on file    Gets together: Not on file    Attends religious service: Not on file    Active member of club or organization: Not on file    Attends meetings of clubs or organizations: Not on file    Relationship status: Not on file  Other Topics Concern   Not on file  Social History Narrative   Not on file     Review of Systems: A 12 point ROS discussed and pertinent positives are indicated in the HPI above.  All other systems are negative.  Review of Systems  Constitutional: Negative for chills and fever.  Respiratory: Negative for cough and shortness of breath.   Cardiovascular: Negative for chest pain.  Gastrointestinal: Negative for abdominal pain, diarrhea, nausea and vomiting.  Genitourinary: Negative for dysuria and hematuria.  Musculoskeletal: Negative for back pain.  Skin: Negative for rash.  Neurological: Negative for dizziness and light-headedness.    Vital Signs: There were no vitals taken for this visit.  Physical Exam Vitals signs reviewed.  Constitutional:      General: He is not in acute distress. HENT:     Head: Normocephalic.    Cardiovascular:     Rate and Rhythm: Normal rate and regular rhythm.  Pulmonary:     Effort: Pulmonary effort is normal.     Breath sounds: Normal breath sounds.  Abdominal:     General: Bowel sounds are normal. There is no distension.     Palpations: Abdomen is soft.     Tenderness: There is no abdominal tenderness.  Skin:    General: Skin is warm and dry.  Neurological:     Mental Status: He is alert and oriented to person, place, and time.  Psychiatric:        Mood and Affect: Mood normal.        Behavior: Behavior normal.  Thought Content: Thought content normal.        Judgment: Judgment normal.      MD Evaluation Airway: WNL Heart: WNL Abdomen: WNL Chest/ Lungs: WNL ASA  Classification: 3 Mallampati/Airway Score: One   Imaging: Ir Radiologist Eval & Mgmt  Result Date: 05/04/2019 Please refer to notes tab for details about interventional procedure. (Op Note)   Labs:  CBC: Recent Labs    05/13/19 0630  WBC 6.3  HGB 14.8  HCT 43.1  PLT 203    COAGS: Recent Labs    05/13/19 0630  INR 1.1    BMP: Recent Labs    05/13/19 0630  NA 139  K 4.1  CL 103  CO2 26  GLUCOSE 103*  BUN 17  CALCIUM 9.9  CREATININE 1.29*  GFRNONAA 51*  GFRAA 59*    Assessment and Plan:  85 y/o M with history of Type II Endoleak after AAA repair in 2015 who previously underwent transarterial embolization of the lumbar arteries on 11/27/16 with Dr. Barbie Banner who presents today for image guided abdominal/pelvic venogram/possible abdominal/pelvic angiogram/possible percutaneous sac puncture with intervention.  Patient has been NPO since 9 pm, last dose of Eliquis 7/24, he took his morning medications with a small sip of water. Afebrile, COVID (-) on 7/25, WBC 6.3, hgb 14.8, plt 203, creatinine 1.29, INR 1.1.  Risks and benefits of abdominal/pelvic venogram/possible abdominal/pelvic angiogram/possible direct sac puncture with intervention were discussed with the patient  including, but not limited to bleeding, infection, vascular injury, contrast induced renal failure, stroke or even death.  This interventional procedure involves the use of X-rays and because of the nature of the planned procedure, it is possible that we will have prolonged use of X-ray fluoroscopy. Potential radiation risks to you include (but are not limited to) the following: - A slightly elevated risk for cancer  several years later in life. This risk is typically less than 0.5% percent. This risk is low in comparison to the normal incidence of human cancer, which is 33% for women and 50% for men according to the [place]. - Radiation induced injury can include skin redness, resembling a rash, tissue breakdown / ulcers and hair loss (which can be temporary or permanent).  The likelihood of either of these occurring depends on the difficulty of the procedure and whether you are sensitive to radiation due to previous procedures, disease, or genetic conditions.  IF your procedure requires a prolonged use of radiation, you will be notified and given written instructions for further action.  It is your responsibility to monitor the irradiated area for the 2 weeks following the procedure and to notify your physician if you are concerned that you have suffered a radiation induced injury.    All of the patient's questions were answered, patient is agreeable to proceed.  Consent signed and in chart.  Thank you for this interesting consult.  I greatly enjoyed meeting Barrett Goldie and look forward to participating in their care.  A copy of this report was sent to the requesting provider on this date.  Electronically Signed: Joaquim Nam, PA-C 05/13/2019, 7:40 AM   I spent a total of25 Minutes in face to face in clinical consultation, greater than 50% of which was counseling/coordinating care for type 2 endoleak repair.

## 2019-05-13 NOTE — Progress Notes (Signed)
Pt admitted to 6N17 from PACU.  Pt has a hemotoma left wrist area from the A line being DC'd. Ice on site, soft.  Bandaide mid abd, right neck gauze with transparent dressing on it as well as right groin.  Foley in place to straight drain.  Dtr called and updated on pt condition and location.  Dinner ordered.

## 2019-05-13 NOTE — Consult Note (Signed)
Urology Consult   Physician requesting consult: Dr. Damita Dunnings  Reason for consult: Urinary retention with difficult urethral catheterization  History of Present Illness: Jimmy Burke is a 85 y.o. male who underwent repeat endovascular AAA repair for recurrent endoleak today.  He did received general anesthesia.  He was unable to have a catheter placed at the time of his procedure due to severe phimosis.  Since his procedure, he has been unable to void and does have a strong urge to urinate.  He denies a history of chronic lower urinary tract symptoms.  He does admit to having phimosis that has become more problematic over the last 2-3 months.  However, he states that he has been able to void and empty his bladder without difficulty subjectively.  He denies a history of voiding or storage urinary symptoms, hematuria, UTIs, STDs, urolithiasis, GU malignancy/trauma/surgery.  Past Medical History:  Diagnosis Date  . Atrial fibrillation ([place])   . Chronic anticoagulation   . Endoleak post (EVAR) endovascular aneurysm repair ([place])   . Full dentures   . Hypertension     Past Surgical History:  Procedure Laterality Date  . ABDOMINAL AORTIC ANEURYSM REPAIR    . CHOLECYSTECTOMY    . EYE SURGERY     Bilateral cataract surgery    . HERNIA REPAIR Right    inguinal hernia  . IR GENERIC HISTORICAL  05/02/2016   IR RADIOLOGIST EVAL & MGMT 05/02/2016 Marybelle Killings, MD [facility] INTERV RAD  . IR GENERIC HISTORICAL  11/15/2016   IR RADIOLOGIST EVAL & MGMT 11/15/2016 Marybelle Killings, MD [facility] INTERV RAD  . IR RADIOLOGIST EVAL & MGMT  02/06/2017  . IR RADIOLOGIST EVAL & MGMT  05/16/2017  . IR RADIOLOGIST EVAL & MGMT  12/03/2017  . IR RADIOLOGIST EVAL & MGMT  05/04/2019  . MULTIPLE TOOTH EXTRACTIONS      Medications:  Home meds:  No current facility-administered medications on file prior to encounter.    Current Outpatient Medications on File Prior to Encounter  Medication Sig Dispense Refill  . apixaban  (ELIQUIS) 5 MG TABS tablet Take 5 mg by mouth daily.    Marland Kitchen aspirin 81 MG tablet Take 81 mg by mouth daily.    . cholecalciferol (VITAMIN D3) 25 MCG (1000 UT) tablet Take 1,000 Units by mouth daily.    Marland Kitchen diltiazem (CARDIZEM CD) 120 MG 24 hr capsule Take 120 mg by mouth daily.    . metoprolol succinate (TOPROL-XL) 100 MG 24 hr tablet Take 100 mg by mouth daily. Take with or immediately following a meal.    . simvastatin (ZOCOR) 20 MG tablet Take 20 mg by mouth daily.    Marland Kitchen desonide (DESOWEN) 0.05 % cream Apply 1 application topically daily as needed (irritation, dry skin).    Marland Kitchen donepezil (ARICEPT) 5 MG tablet Take 5 mg by mouth daily.     Marland Kitchen ketoconazole (NIZORAL) 2 % cream Apply 1 application topically daily as needed for irritation (dry skin).     . Multiple Vitamin (MULTIVITAMIN WITH MINERALS) TABS tablet Take 1 tablet by mouth daily.       Scheduled Meds: . aspirin  81 mg Oral Daily  . diltiazem  120 mg Oral Daily  . donepezil  5 mg Oral Daily  . metoprolol succinate  100 mg Oral Daily  . midazolam      . simvastatin  20 mg Oral Daily   Continuous Infusions: PRN Meds:.  Allergies: No Known Allergies  History reviewed. No pertinent family history.  Social History:  reports that he has quit smoking. His smoking use included cigarettes. He has never used smokeless tobacco. He reports previous alcohol use. He reports that he does not use drugs.  ROS: A complete review of systems was performed.  All systems are negative except for pertinent findings as noted.  Physical Exam:  Vital signs in last 24 hours: Temp:  [97 F (36.1 C)-97.7 F (36.5 C)] 97 F (36.1 C) (07/29 1210) Pulse Rate:  [58-86] 86 (07/29 1430) Resp:  [13-18] 15 (07/29 1430) BP: (89-160)/(36-92) 122/92 (07/29 1425) SpO2:  [97 %-100 %] 100 % (07/29 1430) Arterial Line BP: (92-155)/(26-72) 155/71 (07/29 1430) Weight:  [56.2 kg] 56.2 kg (07/29 0749) Constitutional:  Alert and oriented, No acute  distress Cardiovascular: Irregular, No JVD Respiratory: Normal respiratory effort GI: Moderate SP distention/tenderness Genitourinary: No CVAT.   He has severe phimosis with only a pinpoint opening noted.  His penile shaft is normal.  No underlying masses are appreciated over the glans penis. Lymphatic: No lymphadenopathy Neurologic: Grossly intact, no focal deficits Psychiatric: Normal mood and affect  Laboratory Data:  Recent Labs    05/13/19 0630  WBC 6.3  HGB 14.8  HCT 43.1  PLT 203    Recent Labs    05/13/19 0630  NA 139  K 4.1  CL 103  GLUCOSE 103*  BUN 17  CALCIUM 9.9  CREATININE 1.29*     Results for orders placed or performed during the hospital encounter of 05/13/19 (from the past 24 hour(s))  Protime-INR     Status: None   Collection Time: 05/13/19  6:30 AM  Result Value Ref Range   Prothrombin Time 13.8 11.4 - 15.2 seconds   INR 1.1 0.8 - 1.2  Basic metabolic panel     Status: Abnormal   Collection Time: 05/13/19  6:30 AM  Result Value Ref Range   Sodium 139 135 - 145 mmol/L   Potassium 4.1 3.5 - 5.1 mmol/L   Chloride 103 98 - 111 mmol/L   CO2 26 22 - 32 mmol/L   Glucose, Bld 103 (H) 70 - 99 mg/dL   BUN 17 8 - 23 mg/dL   Creatinine, Ser 1.29 (H) 0.61 - 1.24 mg/dL   Calcium 9.9 8.9 - 10.3 mg/dL   GFR calc non Af Amer 51 (L) >60 mL/min   GFR calc Af Amer 59 (L) >60 mL/min   Anion gap 10 5 - 15  CBC with Differential/Platelet     Status: Abnormal   Collection Time: 05/13/19  6:30 AM  Result Value Ref Range   WBC 6.3 4.0 - 10.5 K/uL   RBC 4.12 (L) 4.22 - 5.81 MIL/uL   Hemoglobin 14.8 13.0 - 17.0 g/dL   HCT 43.1 39.0 - 52.0 %   MCV 104.6 (H) 80.0 - 100.0 fL   MCH 35.9 (H) 26.0 - 34.0 pg   MCHC 34.3 30.0 - 36.0 g/dL   RDW 11.9 11.5 - 15.5 %   Platelets 203 150 - 400 K/uL   nRBC 0.0 0.0 - 0.2 %   Neutrophils Relative % 61 %   Neutro Abs 3.9 1.7 - 7.7 K/uL   Lymphocytes Relative 23 %   Lymphs Abs 1.4 0.7 - 4.0 K/uL   Monocytes Relative 11 %    Monocytes Absolute 0.7 0.1 - 1.0 K/uL   Eosinophils Relative 3 %   Eosinophils Absolute 0.2 0.0 - 0.5 K/uL   Basophils Relative 1 %   Basophils Absolute 0.0 0.0 - 0.1  K/uL   Immature Granulocytes 1 %   Abs Immature Granulocytes 0.03 0.00 - 0.07 K/uL  Type and screen     Status: None   Collection Time: 05/13/19  7:02 AM  Result Value Ref Range   ABO/RH(D) A POS    Antibody Screen NEG    Sample Expiration      05/16/2019,2359 Performed at [facility], [place] [address], [place], [place] [address]   ABO/Rh     Status: None   Collection Time: 05/13/19  7:02 AM  Result Value Ref Range   ABO/RH(D)      A POS Performed at [facility] [address] [place], [place] [address]    Recent Results (from the past 240 hour(s))  SARS Coronavirus 2 (Performed in [place] hospital lab)     Status: None   Collection Time: 05/09/19  2:22 PM   Specimen: Nasal Swab  Result Value Ref Range Status   SARS Coronavirus 2 NEGATIVE NEGATIVE Final    Comment: (NOTE) SARS-CoV-2 target nucleic acids are NOT DETECTED. The SARS-CoV-2 RNA is generally detectable in upper and lower respiratory specimens during the acute phase of infection. Negative results do not preclude SARS-CoV-2 infection, do not rule out co-infections with other pathogens, and should not be used as the sole basis for treatment or other patient management decisions. Negative results must be combined with clinical observations, patient history, and epidemiological information. The expected result is Negative. Fact Sheet for Patients: SugarRoll.be Fact Sheet for Healthcare Providers: https://www.woods-mathews.com/ This test is not yet approved or cleared by the [place] FDA and  has been authorized for detection and/or diagnosis of SARS-CoV-2 by FDA under an Emergency Use Authorization (EUA). This EUA will remain  in effect (meaning this test can be used) for the duration of  the COVID-19 declaration under Section 56 4(b)(1) of the Act, 21 U.S.C. section 360bbb-3(b)(1), unless the authorization is terminated or revoked sooner. Performed at [facility], [place] [address], [place], [place] [address]     Renal Function: Recent Labs    05/13/19 0630  CREATININE 1.29*   Estimated Creatinine Clearance: 33.9 mL/min (A) (by C-G formula based on SCr of 1.29 mg/dL (H)).    Procedure:   I was able to gently dilate the opening of his foreskin with Betadine and a hemostats.  This allowed exposure a small portion of the glans penis and the urethral meatus was able to be identified.  Additional Betadine was applied.  I then placed a 14 French Foley catheter under sterile conditions with return of approximately 600 cc of grossly clear urine.  Impression/Recommendation   1. Urinary retention /phimosis:  The cause for his retention is unclear but he definitely has severe phimosis which may be at least a component if not the main cause of his retention.  He also received general anesthesia today.  I do think it would be advisable for him to keep an indwelling catheter until next week.  He can therefore be discharged home with a leg bag.  We will then have him follow up as an outpatient for a controlled voiding trial and will discuss topical steroid cream versus circumcision.  He does appear to be against proceeding with a circumcision if at all possible.    I will arrange his outpatient follow-up for next week.  Please call if further questions in the meantime.  Dutch Gray 05/13/2019, 3:45 PM    Pryor Curia MD  CC:  Dr. Damita Dunnings

## 2019-05-13 NOTE — Anesthesia Procedure Notes (Signed)
Arterial Line Insertion Start/End7/29/2020 7:40 AM, 05/13/2019 7:48 AM Performed by: CRNA  Patient location: Pre-op. Lidocaine 1% used for infiltration Left, radial was placed Catheter size: 20 G Hand hygiene performed , maximum sterile barriers used  and Seldinger technique used Allen's test indicative of satisfactory collateral circulation Attempts: 3 Procedure performed without using ultrasound guided technique. Following insertion, dressing applied and Biopatch. Post procedure assessment: normal  Post procedure complications: local hematoma. Patient tolerated the procedure well with no immediate complications.

## 2019-05-13 NOTE — Anesthesia Postprocedure Evaluation (Signed)
Anesthesia Post Note  Patient: Jimmy Burke  Procedure(s) Performed: TYPE 2 ENDOLEAK (N/A )     Patient location during evaluation: PACU Anesthesia Type: General Level of consciousness: awake and alert Pain management: pain level controlled Vital Signs Assessment: post-procedure vital signs reviewed and stable Respiratory status: spontaneous breathing, nonlabored ventilation, respiratory function stable and patient connected to nasal cannula oxygen Cardiovascular status: blood pressure returned to baseline and stable Postop Assessment: no apparent nausea or vomiting Anesthetic complications: no    Last Vitals:  Vitals:   05/13/19 1257 05/13/19 1300  BP: (!) 96/46   Pulse: 69 78  Resp: 17 17  Temp:    SpO2: 99% 98%    Last Pain:  Vitals:   05/13/19 1300  PainSc: 0-No pain                 Allex Lapoint DAVID

## 2019-05-14 ENCOUNTER — Encounter (HOSPITAL_COMMUNITY): Payer: Self-pay | Admitting: Radiology

## 2019-05-14 ENCOUNTER — Other Ambulatory Visit: Payer: No Typology Code available for payment source

## 2019-05-14 DIAGNOSIS — I714 Abdominal aortic aneurysm, without rupture: Secondary | ICD-10-CM | POA: Diagnosis not present

## 2019-05-14 NOTE — Discharge Summary (Signed)
Patient ID: Jimmy Burke MRN: [id number] DOB/AGE: 02-10-1933 85 y.o.  Admit date: 05/13/2019 Discharge date: 05/14/2019  Supervising Physician: Jacqulynn Cadet  Patient Status: [facility] - In-pt  Admission Diagnoses: Endoleak of aortic graft  Discharge Diagnoses:  Active Problems:   Endoleak of aortic graft [facility])   Discharged Condition: good  Hospital Course:  Jimmy Burke is a 85 y.o. 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 Denyce Robert (2015) who presented to [facility] Radiology yesterday for planned Endoleak repair.  Patient now s/p combination of coil and thrombin embolization of aneurysm sac with three puncture sites, R IJ, R saphenofemoral vein, and transabdominal puncture. 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  Patient is uncircumcised and reported symptoms of painful phimosis at home for which he had not sought evaluation.   He was admitted overnight for observation.  Urology was also consulted.  Dr. Alinda Money evaluated the patient and was able to reduce the phimosis and place a foley catheter.  Patient assesded this AM and found to be in good condition.  His Foley catheter remains in place with dark urine output.    He is recovering well from procedure today.  He has tolerated a diet this morning.  He has been able to ambulate in the room without difficulty.  He is stable for discharge home with outpatient urology and IR follow-up.  Per Dr. Lynne Logan instructions patient to be discharged with Foley in place. Patient understands this will be arranged at a later date and that he will hear from schedulers.   Discharge Exam: Blood pressure 137/70, pulse 83, temperature 98.6 F (37 C), temperature source Oral, resp. rate 17, height 5\' 6"  (1.676 m), weight 124 lb (56.2 kg), SpO2 96 %. General  appearance: alert, cooperative and no distress Neck: supple, symmetrical, trachea midline Resp: clear to auscultation bilaterally Cardio: regular rate and rhythm, S1, S2 normal, no murmur, click, rub or gallop GI: soft, non-tender; bowel sounds normal; no masses,  no organomegaly and distention Skin: Skin color, texture, turgor normal. No rashes or lesions Incision/Wound:  Disposition: Discharge disposition: 01-Home or Self Care       Discharge Instructions    Diet - low sodium heart healthy   Complete by: As directed    Discharge instructions   Complete by: As directed    Discharge home with foley catheter in place.  Patient to follow-up with Dr. Borden/Urology in the next 1 week.    Do not submerge puncture sites from procedure for 7 days.  May remove dressing/bandages in 24 hrs.   Follow-up with Dr. Earleen Newport in 4-8 weeks for imaging and evaluation.  You will hear from schedulers to arrange.   Increase activity slowly   Complete by: As directed      Allergies as of 05/14/2019   No Known Allergies     Medication List    TAKE these medications   apixaban 5 MG Tabs tablet Commonly known as: ELIQUIS Take 5 mg by mouth daily.   aspirin 81 MG tablet Take 81 mg by mouth daily.   cholecalciferol 25 MCG (1000 UT) tablet Commonly known as: VITAMIN D3 Take 1,000 Units by mouth daily.   desonide 0.05 % cream Commonly known as: DESOWEN Apply 1 application topically daily as needed (irritation, dry skin).   diltiazem 120 MG 24  hr capsule Commonly known as: CARDIZEM CD Take 120 mg by mouth daily.   donepezil 5 MG tablet Commonly known as: ARICEPT Take 5 mg by mouth daily.   ketoconazole 2 % cream Commonly known as: NIZORAL Apply 1 application topically daily as needed for irritation (dry skin).   metoprolol succinate 100 MG 24 hr tablet Commonly known as: TOPROL-XL Take 100 mg by mouth daily. Take with or immediately following a meal.   multivitamin with minerals  Tabs tablet Take 1 tablet by mouth daily.   simvastatin 20 MG tablet Commonly known as: ZOCOR Take 20 mg by mouth daily.      Follow-up Information    Corrie Mckusick, DO Follow up.   Specialties: Interventional Radiology, Radiology Why: Schedulers will contact you with date and time of appointment. Contact information: [address] [place] [address] [phone]        Raynelle Bring, MD Follow up.   Specialty: Urology Why: Call office for appointment if you do not hear from them by early next week. Contact information: [address] [place] [address] [phone]            Electronically Signed: Docia Barrier, PA 05/14/2019, 12:00 PM   I have spent Greater Than 30 Minutes discharging Jimmy Burke.

## 2019-05-14 NOTE — Progress Notes (Signed)
Patient discharged to home with instructions and some supplies.

## 2019-05-18 ENCOUNTER — Encounter (HOSPITAL_COMMUNITY): Payer: Self-pay

## 2019-05-19 ENCOUNTER — Other Ambulatory Visit: Payer: Self-pay | Admitting: *Deleted

## 2019-05-19 DIAGNOSIS — T82330D Leakage of aortic (bifurcation) graft (replacement), subsequent encounter: Secondary | ICD-10-CM

## 2019-05-19 DIAGNOSIS — IMO0001 Reserved for inherently not codable concepts without codable children: Secondary | ICD-10-CM

## 2019-06-10 ENCOUNTER — Encounter: Payer: Self-pay | Admitting: *Deleted

## 2019-06-10 ENCOUNTER — Other Ambulatory Visit: Payer: Self-pay

## 2019-06-10 ENCOUNTER — Ambulatory Visit
Admission: RE | Admit: 2019-06-10 | Discharge: 2019-06-10 | Disposition: A | Discharge status: Home / Self Care | Payer: Medicare Other | Source: Ambulatory Visit | Attending: Student | Admitting: Student

## 2019-06-10 DIAGNOSIS — T82330A Leakage of aortic (bifurcation) graft (replacement), initial encounter: Secondary | ICD-10-CM

## 2019-06-10 DIAGNOSIS — IMO0002 Reserved for concepts with insufficient information to code with codable children: Secondary | ICD-10-CM

## 2019-06-10 HISTORY — PX: IR RADIOLOGIST EVAL & MGMT: IMG5224

## 2019-06-10 NOTE — Progress Notes (Signed)
Chief Complaint: Aortic Endograft Type II Endoleak   Referring Physician(s): Dr. Denyce Robert  History of Present Illness: Jimmy Burke is a 85 y.o. male presenting as a scheduled follow up to Jimmy Burke today, now SP his second late intervention for a persisting type II endoleak, and enlarging aneurysm sac.   I spoke with Jimmy Burke and his daughter today via telephone, given the ongoing COVID situation.    He is a gentleman who is known to our service, with a history of AAA repair at [facility] with Dr. Maryjean Morn.  This was performed with infrarenal fixation with a Gore Endograft in April of 2015.  He was first treated for a persisting endoleak and enlarging sac by my late [employer] partner, Dr. Barbie Banner, with embolization of contributing lumbar arteries 11/27/2016.    There was relative stability after the treatment, though ultimately the sac enlarged to about 7.9cm prompting [place] to re-treat him.  This most recent treatment was performed at [facility] with the use of IVUS, and both a trans-caval approach and trans-abdominal approach.  Combination of coil embolization and direct thrombin injection was used.   The DSA imaging during the case shows some contrast exiting the aneurysm sac into the supra-renal aorta.   This is felt to represent a low-resistance pathway from the sac around the graft, as there is no evidence otherwise of a type 1a endoleak on CTA or prior angiogram.   Incidentally discovered during his case, once he was intubated with GETA, was a severe phimosis.  This required a consult to Urology service.  Ultimately he was discharged with foley in place and follow up.   Today he tells me that he "hasn't felt this good in months."  He denies any abdominal pain, fever, rigors, chills.  He has had Urology appointment and he has another in follow up.  Currently he tells me the foley has been removed.    He has had non-contrast CT performed 06/08/2019.  The greatest estimated diameter of the sac is 7.7cm,  with the largest diameter on previous scans as large as 7.9cm/8.0cm.  There are no concerning features such as inflammatory changes or fluid.  The significant streak artifact from the coils would somewhat limit evaluation if contrast was present.      Past Medical History:  Diagnosis Date   Atrial fibrillation (HCC)    Chronic anticoagulation    Endoleak post (EVAR) endovascular aneurysm repair ([place])    Full dentures    Hypertension     Past Surgical History:  Procedure Laterality Date   ABDOMINAL AORTIC ANEURYSM REPAIR     CHOLECYSTECTOMY     EYE SURGERY     Bilateral cataract surgery     HERNIA REPAIR Right    inguinal hernia   IR AORTAGRAM ABDOMINAL SERIALOGRAM  05/13/2019   IR CT SPINE LTD  05/13/2019   IR EMBO ARTERIAL NOT HEMORR HEMANG INC GUIDE ROADMAPPING  05/13/2019   IR GENERIC HISTORICAL  05/02/2016   IR RADIOLOGIST EVAL & MGMT 05/02/2016 Marybelle Killings, MD [facility] INTERV RAD   IR GENERIC HISTORICAL  11/15/2016   IR RADIOLOGIST EVAL & MGMT 11/15/2016 Marybelle Killings, MD [facility] INTERV RAD   IR INTRAVASCULAR ULTRASOUND NON CORONARY  05/13/2019   IR RADIOLOGIST EVAL & MGMT  02/06/2017   IR RADIOLOGIST EVAL & MGMT  05/16/2017   IR RADIOLOGIST EVAL & MGMT  12/03/2017   IR RADIOLOGIST EVAL & MGMT  05/04/2019   IR US GUIDE VASC ACCESS RIGHT  05/13/2019  IR US GUIDE VASC ACCESS RIGHT  05/13/2019   IR VENOCAVAGRAM IVC  05/13/2019   MULTIPLE TOOTH EXTRACTIONS     RADIOLOGY WITH ANESTHESIA N/A 05/13/2019   Procedure: TYPE 2 ENDOLEAK;  Surgeon: Radiologist, Medication, MD;  Location: [facility];  Service: Radiology;  Laterality: N/A;    Allergies: Patient has no known allergies.  Medications: Prior to Admission medications   Medication Sig Start Date End Date Taking? Authorizing Provider  apixaban (ELIQUIS) 5 MG TABS tablet Take 5 mg by mouth daily.    [provider]  aspirin 81 MG tablet Take 81 mg by mouth daily.    [provider]  cholecalciferol  (VITAMIN D3) 25 MCG (1000 UT) tablet Take 1,000 Units by mouth daily.    [provider]  desonide (DESOWEN) 0.05 % cream Apply 1 application topically daily as needed (irritation, dry skin).    [provider]  diltiazem (CARDIZEM CD) 120 MG 24 hr capsule Take 120 mg by mouth daily. 04/28/19   [provider]  donepezil (ARICEPT) 5 MG tablet Take 5 mg by mouth daily.     [provider]  ketoconazole (NIZORAL) 2 % cream Apply 1 application topically daily as needed for irritation (dry skin).     [provider]  metoprolol succinate (TOPROL-XL) 100 MG 24 hr tablet Take 100 mg by mouth daily. Take with or immediately following a meal.    [provider]  Multiple Vitamin (MULTIVITAMIN WITH MINERALS) TABS tablet Take 1 tablet by mouth daily.    [provider]  simvastatin (ZOCOR) 20 MG tablet Take 20 mg by mouth daily.    [provider]     No family history on file.  Social History   Socioeconomic History   Marital status: Widowed    Spouse name: Not on file   Number of children: Not on file   Years of education: Not on file   Highest education level: Not on file  Occupational History   Not on file  Social Needs   Financial resource strain: Not on file   Food insecurity    Worry: Not on file    Inability: Not on file   Transportation needs    Medical: Not on file    Non-medical: Not on file  Tobacco Use   Smoking status: Former Smoker    Types: Cigarettes   Smokeless tobacco: Never Used   Tobacco comment: quit smoking cigarettes in 1990's  Substance and Sexual Activity   Alcohol use: Not Currently    Alcohol/week: 0.0 standard drinks   Drug use: No   Sexual activity: Not on file  Lifestyle   Physical activity    Days per week: Not on file    Minutes per session: Not on file   Stress: Not on file  Relationships   Social connections    Talks on phone: Not on file    Gets together:  Not on file    Attends religious service: Not on file    Active member of club or organization: Not on file    Attends meetings of clubs or organizations: Not on file    Relationship status: Not on file  Other Topics Concern   Not on file  Social History Narrative   Not on file       Review of Systems  Review of Systems: A 12 point ROS discussed and pertinent positives are indicated in the HPI above.  All other systems are negative.  Physical Exam No direct physical exam was performed (except for noted visual exam findings with Video Visits).    Vital Signs: There were no vitals taken for this visit.  Imaging: Ir Aortagram Abdominal Serialogram  Result Date: 05/13/2019 INDICATION: 85 year old male with a history of prior endograft repair with expanding aneurysm and a known type 2 endoleak EXAM: ULTRASOUND GUIDED ACCESS RIGHT COMMON FEMORAL VEIN ULTRASOUND GUIDED ACCESS RIGHT INTERNAL JUGULAR VEIN CAVAGRAM IVUS EVALUATION OF THE IVC WITH INTRAVASCULAR ULTRASOUND GUIDED TRANS VENOUS SAC ACCESS CT-GUIDED PERCUTANEOUS SAC ACCESS EMBOLIZATION OF TYPE 2 ENDOLEAK WITH COIL EMBOLIZATION AND THROMBIN MEDICATIONS: 2 g Ancef. The antibiotic was administered within 1 hour of the procedure ANESTHESIA/SEDATION: General endotracheal tube anesthesia The patient's level of consciousness and vital signs were monitored continuously by radiology nursing as well as the anesthesia team throughout the procedure under my direct supervision. CONTRAST:  34m OMNIPAQUE IOHEXOL 300 MG/ML  SOLN FLUOROSCOPY TIME:  Fluoroscopy Time: 28 minutes 30 seconds (1981 mGy). COMPLICATIONS: None PROCEDURE: Informed consent was obtained from the patient following explanation of the procedure, risks, benefits and alternatives. The patient understands, agrees and consents for the procedure. All questions were addressed. A time out was performed prior to the initiation of the procedure. Maximal barrier sterile technique utilized  including caps, mask, sterile gowns, sterile gloves, large sterile drape, hand hygiene, and Betadine prep. Patient is brought to the interventional suite. General anesthesia was induced by the anesthesia team. The patient was positioned supine on the fluoroscopy table. The bilateral inguinal region, the anterior abdomen, and the right neck were prepped and draped in the usual sterile fashion. Ultrasound-guided access was then performed with placement of a 5 French sheath into the saphenofemoral junction. Bentson wire was advanced into the IVC, and IVC cavogram was performed. Bentson wire was placed and then a 9 French sheath was placed into the vein. The IVUS catheter was advanced onto the wire and advanced to the hepatic IVC. Pullback intravascular ultrasound was performed. We then used micropuncture access and ultrasound guidance to place a guidewire into the right internal jugular vein. A 10 French sheath was placed, from the [employer] The sheath was placed into the infrarenal IVC, and the cannula and 5 French catheter were then brought to the end of the sheath. Using both intravascular ultrasound and fluoroscopic guidance, trans venous access into the sac performed. With the 5 French catheter within the sac, sacography was performed. No flow was identified on the right aspect of the partially excluded aneurysm sac. Given that there was no flow, a flat panel CT was performed to confirm location within the sac. Using the flat panel CT, anterior abdominal puncture was then performed targeting the left aspect of the sac, in and known region of flow on the prior CT. A 15 cm 18 gauge needle trocar was advanced. With the needle in place the stylet was removed and arterial blood was returned. Sacography was again performed. A check flow valve was placed on the back end of the 18 gauge trocar, and microcatheter microwire combination advanced into the sac. We spent a significant time navigating through the sac in  attempt to place the microcatheter into the ostium of the feeding arteries. Ultimately we elected to proceed with coil embolization. Coil embolization was then performed within the flow channel in the lower excluded sac. Repeat digital subtraction imaging revealed slower flow at the completion of coil embolization. Diluted thrombin was then infused, with a total of 2000 units slowly infused into the  sac. Final angiogram reveals no continuous flow, with no identifiable arterial ostium or outflow, and static contrast within the sac. Final cavogram was performed after withdrawal of the 5 French catheter from the trans caval access. The venous access were removed with manual pressure for hemostasis. Sterile bandages were placed. The patient tolerated the procedure well and remained hemodynamically stable throughout. No complications were encountered no significant blood loss FINDINGS: Ultrasound survey is demonstrate patent right IJ vein and patent right common femoral vein. Cavagram demonstrates no irregularity of the IVC Initial trans caval access into the sac demonstrates no cross flow, with no identification of any feeding vasculature, with the contrast static within the partially excluded sac. Transabdominal puncture demonstrates large component of patent excluded sac on the left aspect of the sac. There are segmental arteries perfusing from above and below the prior embolization which include median sacral artery below and a combination of left and right lumbar arteries above. The IMA origin was not identified. Injection from the original needle placement demonstrates contrast flowing above the level of the endograft in the abdominal aorta, potentially representing a type 1 endoleak. Navigating the microcatheter within the aneurysm sac was unable to identify the ostium of any of the feeding arteries. Coil embolization proceeded with small volume (2000 units) of thrombin infused at the conclusion for stasis. After  embolization no further flow into the sac was identified. Contrast was static on the final injection. Final cavogram after removal of the 5 [place] trans caval catheter demonstrates no irregularity. Final spin CT demonstrates no extraluminal contrast, with static contrast within the aneurysm sac. IMPRESSION: Status post ultrasound guided access right common femoral vein, right IJ turn jugular vein, and direct percutaneous access of the aneurysm sac, 4 coil and thrombin embolization of the persisting type 2 endoleak. There is a potential type 1 endoleak, with contrast extending above the margin of the superior margin of the [employer] device. Signed, Dulcy Fanny. Dellia Nims, RPVI [facility] Radiology Electronically Signed   By: Corrie Mckusick D.O.   On: 05/13/2019 16:33   Ir Mariana Arn Ivc  Result Date: 05/13/2019 INDICATION: 85 year old male with a history of prior endograft repair with expanding aneurysm and a known type 2 endoleak EXAM: ULTRASOUND GUIDED ACCESS RIGHT COMMON FEMORAL VEIN ULTRASOUND GUIDED ACCESS RIGHT INTERNAL JUGULAR VEIN CAVAGRAM IVUS EVALUATION OF THE IVC WITH INTRAVASCULAR ULTRASOUND GUIDED TRANS VENOUS SAC ACCESS CT-GUIDED PERCUTANEOUS SAC ACCESS EMBOLIZATION OF TYPE 2 ENDOLEAK WITH COIL EMBOLIZATION AND THROMBIN MEDICATIONS: 2 g Ancef. The antibiotic was administered within 1 hour of the procedure ANESTHESIA/SEDATION: General endotracheal tube anesthesia The patient's level of consciousness and vital signs were monitored continuously by radiology nursing as well as the anesthesia team throughout the procedure under my direct supervision. CONTRAST:  21m OMNIPAQUE IOHEXOL 300 MG/ML  SOLN FLUOROSCOPY TIME:  Fluoroscopy Time: 28 minutes 30 seconds (1981 mGy). COMPLICATIONS: None PROCEDURE: Informed consent was obtained from the patient following explanation of the procedure, risks, benefits and alternatives. The patient understands, agrees and  consents for the procedure. All questions were addressed. A time out was performed prior to the initiation of the procedure. Maximal barrier sterile technique utilized including caps, mask, sterile gowns, sterile gloves, large sterile drape, hand hygiene, and Betadine prep. Patient is brought to the interventional suite. General anesthesia was induced by the anesthesia team. The patient was positioned supine on the fluoroscopy table. The bilateral inguinal region, the anterior abdomen, and the right neck were prepped and draped in the  usual sterile fashion. Ultrasound-guided access was then performed with placement of a 5 French sheath into the saphenofemoral junction. Bentson wire was advanced into the IVC, and IVC cavogram was performed. Bentson wire was placed and then a 9 French sheath was placed into the vein. The IVUS catheter was advanced onto the wire and advanced to the hepatic IVC. Pullback intravascular ultrasound was performed. We then used micropuncture access and ultrasound guidance to place a guidewire into the right internal jugular vein. A 10 French sheath was placed, from the [employer]. The sheath was placed into the infrarenal IVC, and the cannula and 5 French catheter were then brought to the end of the sheath. Using both intravascular ultrasound and fluoroscopic guidance, trans venous access into the sac performed. With the 5 French catheter within the sac, sacography was performed. No flow was identified on the right aspect of the partially excluded aneurysm sac. Given that there was no flow, a flat panel CT was performed to confirm location within the sac. Using the flat panel CT, anterior abdominal puncture was then performed targeting the left aspect of the sac, in and known region of flow on the prior CT. A 15 cm 18 gauge needle trocar was advanced. With the needle in place the stylet was removed and arterial blood was returned. Sacography was again performed. A check flow valve was  placed on the back end of the 18 gauge trocar, and microcatheter microwire combination advanced into the sac. We spent a significant time navigating through the sac in attempt to place the microcatheter into the ostium of the feeding arteries. Ultimately we elected to proceed with coil embolization. Coil embolization was then performed within the flow channel in the lower excluded sac. Repeat digital subtraction imaging revealed slower flow at the completion of coil embolization. Diluted thrombin was then infused, with a total of 2000 units slowly infused into the sac. Final angiogram reveals no continuous flow, with no identifiable arterial ostium or outflow, and static contrast within the sac. Final cavogram was performed after withdrawal of the 5 French catheter from the trans caval access. The venous access were removed with manual pressure for hemostasis. Sterile bandages were placed. The patient tolerated the procedure well and remained hemodynamically stable throughout. No complications were encountered no significant blood loss FINDINGS: Ultrasound survey is demonstrate patent right IJ vein and patent right common femoral vein. Cavagram demonstrates no irregularity of the IVC Initial trans caval access into the sac demonstrates no cross flow, with no identification of any feeding vasculature, with the contrast static within the partially excluded sac. Transabdominal puncture demonstrates large component of patent excluded sac on the left aspect of the sac. There are segmental arteries perfusing from above and below the prior embolization which include median sacral artery below and a combination of left and right lumbar arteries above. The IMA origin was not identified. Injection from the original needle placement demonstrates contrast flowing above the level of the endograft in the abdominal aorta, potentially representing a type 1 endoleak. Navigating the microcatheter within the aneurysm sac was unable to  identify the ostium of any of the feeding arteries. Coil embolization proceeded with small volume (2000 units) of thrombin infused at the conclusion for stasis. After embolization no further flow into the sac was identified. Contrast was static on the final injection. Final cavogram after removal of the 5 [place] trans caval catheter demonstrates no irregularity. Final spin CT demonstrates no extraluminal contrast, with static contrast within the aneurysm sac.  IMPRESSION: Status post ultrasound guided access right common femoral vein, right IJ turn jugular vein, and direct percutaneous access of the aneurysm sac, 4 coil and thrombin embolization of the persisting type 2 endoleak. There is a potential type 1 endoleak, with contrast extending above the margin of the superior margin of the [employer] device. Signed, Dulcy Fanny. Dellia Nims, RPVI [facility] Radiology Electronically Signed   By: Corrie Mckusick D.O.   On: 05/13/2019 16:33   [place]  Result Date: 05/13/2019 INDICATION: 85 year old male with a history of prior endograft repair with expanding aneurysm and a known type 2 endoleak EXAM: ULTRASOUND GUIDED ACCESS RIGHT COMMON FEMORAL VEIN ULTRASOUND GUIDED ACCESS RIGHT INTERNAL JUGULAR VEIN CAVAGRAM IVUS EVALUATION OF THE IVC WITH INTRAVASCULAR ULTRASOUND GUIDED TRANS VENOUS SAC ACCESS CT-GUIDED PERCUTANEOUS SAC ACCESS EMBOLIZATION OF TYPE 2 ENDOLEAK WITH COIL EMBOLIZATION AND THROMBIN MEDICATIONS: 2 g Ancef. The antibiotic was administered within 1 hour of the procedure ANESTHESIA/SEDATION: General endotracheal tube anesthesia The patient's level of consciousness and vital signs were monitored continuously by radiology nursing as well as the anesthesia team throughout the procedure under my direct supervision. CONTRAST:  69m OMNIPAQUE IOHEXOL 300 MG/ML  SOLN FLUOROSCOPY TIME:  Fluoroscopy Time: 28 minutes 30 seconds (1981 mGy). COMPLICATIONS: None  PROCEDURE: Informed consent was obtained from the patient following explanation of the procedure, risks, benefits and alternatives. The patient understands, agrees and consents for the procedure. All questions were addressed. A time out was performed prior to the initiation of the procedure. Maximal barrier sterile technique utilized including caps, mask, sterile gowns, sterile gloves, large sterile drape, hand hygiene, and Betadine prep. Patient is brought to the interventional suite. General anesthesia was induced by the anesthesia team. The patient was positioned supine on the fluoroscopy table. The bilateral inguinal region, the anterior abdomen, and the right neck were prepped and draped in the usual sterile fashion. Ultrasound-guided access was then performed with placement of a 5 French sheath into the saphenofemoral junction. Bentson wire was advanced into the IVC, and IVC cavogram was performed. Bentson wire was placed and then a 9 French sheath was placed into the vein. The IVUS catheter was advanced onto the wire and advanced to the hepatic IVC. Pullback intravascular ultrasound was performed. We then used micropuncture access and ultrasound guidance to place a guidewire into the right internal jugular vein. A 10 French sheath was placed, from the [employer] The sheath was placed into the infrarenal IVC, and the cannula and 5 French catheter were then brought to the end of the sheath. Using both intravascular ultrasound and fluoroscopic guidance, trans venous access into the sac performed. With the 5 French catheter within the sac, sacography was performed. No flow was identified on the right aspect of the partially excluded aneurysm sac. Given that there was no flow, a flat panel CT was performed to confirm location within the sac. Using the flat panel CT, anterior abdominal puncture was then performed targeting the left aspect of the sac, in and known region of flow on the prior CT. A 15 cm 18  gauge needle trocar was advanced. With the needle in place the stylet was removed and arterial blood was returned. Sacography was again performed. A check flow valve was placed on the back end of the 18 gauge trocar, and microcatheter microwire combination advanced into the sac. We spent a significant time navigating through the sac in attempt to place the microcatheter into the ostium of  the feeding arteries. Ultimately we elected to proceed with coil embolization. Coil embolization was then performed within the flow channel in the lower excluded sac. Repeat digital subtraction imaging revealed slower flow at the completion of coil embolization. Diluted thrombin was then infused, with a total of 2000 units slowly infused into the sac. Final angiogram reveals no continuous flow, with no identifiable arterial ostium or outflow, and static contrast within the sac. Final cavogram was performed after withdrawal of the 5 French catheter from the trans caval access. The venous access were removed with manual pressure for hemostasis. Sterile bandages were placed. The patient tolerated the procedure well and remained hemodynamically stable throughout. No complications were encountered no significant blood loss FINDINGS: Ultrasound survey is demonstrate patent right IJ vein and patent right common femoral vein. Cavagram demonstrates no irregularity of the IVC Initial trans caval access into the sac demonstrates no cross flow, with no identification of any feeding vasculature, with the contrast static within the partially excluded sac. Transabdominal puncture demonstrates large component of patent excluded sac on the left aspect of the sac. There are segmental arteries perfusing from above and below the prior embolization which include median sacral artery below and a combination of left and right lumbar arteries above. The IMA origin was not identified. Injection from the original needle placement demonstrates contrast  flowing above the level of the endograft in the abdominal aorta, potentially representing a type 1 endoleak. Navigating the microcatheter within the aneurysm sac was unable to identify the ostium of any of the feeding arteries. Coil embolization proceeded with small volume (2000 units) of thrombin infused at the conclusion for stasis. After embolization no further flow into the sac was identified. Contrast was static on the final injection. Final cavogram after removal of the 5 [place] trans caval catheter demonstrates no irregularity. Final spin CT demonstrates no extraluminal contrast, with static contrast within the aneurysm sac. IMPRESSION: Status post ultrasound guided access right common femoral vein, right IJ turn jugular vein, and direct percutaneous access of the aneurysm sac, 4 coil and thrombin embolization of the persisting type 2 endoleak. There is a potential type 1 endoleak, with contrast extending above the margin of the superior margin of the [employer] device. Signed, Dulcy Fanny. Dellia Nims, RPVI V[facility] Inter[facility] Radiology Specialists [facility] Radiology Electronically Signed   By: Corrie Mckusick D.O.   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 The antibiotic was administered within 1 hour of the procedure ANESTHESIA/SEDATION: General endotracheal tube anesthesia The patient's level of consciousness and vital signs were monitored continuously by radiology nursing as well as the anesthesia team  throughout the procedure under my direct supervision. CONTRAST:  70m OMNIPAQUE IOHEXOL 300 MG/ML  SOLN FLUOROSCOPY TIME:  Fluoroscopy Time: 28 minutes 30 seconds (1981 mGy). COMPLICATIONS: None PROCEDURE: Informed consent was obtained from the patient following explanation of the procedure, risks, benefits and alternatives. The patient understands, agrees and consents for the procedure. All questions were addressed. A time out was performed prior to the initiation of the procedure. Maximal barrier sterile technique utilized  including caps, mask, sterile gowns, sterile gloves, large sterile drape, hand hygiene, and Betadine prep. Patient is brought to the interventional suite. General anesthesia was induced by the anesthesia team. The patient was positioned supine on the fluoroscopy table. The bilateral inguinal region, the anterior abdomen, and the right neck were prepped and draped in the usual sterile fashion. Ultrasound-guided access was then performed with placement of a 5 French sheath into the saphenofemoral junction. Bentson wire was advanced into the IVC, and IVC cavogram was performed. Bentson wire was placed and then a 9 French sheath was placed into the vein. The IVUS catheter was advanced onto the wire and advanced to the hepatic IVC. Pullback intravascular ultrasound was performed. We then used micropuncture access and ultrasound guidance to place a guidewire into the right internal jugular vein. A 10 French sheath was placed, from the [employer]. The sheath was placed into the infrarenal IVC, and the cannula and 5 French catheter were then brought to the end of the sheath. Using both intravascular ultrasound and fluoroscopic guidance, trans venous access into the sac performed. With the 5 French catheter within the sac, sacography was performed. No flow was identified on the right aspect of the partially excluded aneurysm sac. Given that there was no flow, a flat panel CT was performed to  confirm location within the sac. Using the flat panel CT, anterior abdominal puncture was then performed targeting the left aspect of the sac, in and known region of flow on the prior CT. A 15 cm 18 gauge needle trocar was advanced. With the needle in place the stylet was removed and arterial blood was returned. Sacography was again performed. A check flow valve was placed on the back end of the 18 gauge trocar, and microcatheter microwire combination advanced into the sac. We spent a significant time navigating through the sac in attempt to place the microcatheter into the ostium of the feeding arteries. Ultimately we elected to proceed with coil embolization. Coil embolization was then performed within the flow channel in the lower excluded sac. Repeat digital subtraction imaging revealed slower flow at the completion of coil embolization. Diluted thrombin was then infused, with a total of 2000 units slowly infused into the sac. Final angiogram reveals no continuous flow, with no identifiable arterial ostium or outflow, and static contrast within the sac. Final cavogram was performed after withdrawal of the 5 French catheter from the trans caval access. The venous access were removed with manual pressure for hemostasis. Sterile bandages were placed. The patient tolerated the procedure well and remained hemodynamically stable throughout. No complications were encountered no significant blood loss FINDINGS: Ultrasound survey is demonstrate patent right IJ vein and patent right common femoral vein. Cavagram demonstrates no irregularity of the IVC Initial trans caval access into the sac demonstrates no cross flow, with no identification of any feeding vasculature, with the contrast static within the partially excluded sac. Transabdominal puncture demonstrates large component of patent excluded sac on the left aspect of the sac. There are segmental arteries perfusing from above and below the prior embolization which  include median sacral artery below and a combination of left and right lumbar arteries above. The IMA origin was not identified. Injection from the original needle placement demonstrates contrast flowing above the level of the endograft in the abdominal aorta, potentially representing a type 1 endoleak. Navigating the microcatheter within the aneurysm sac was unable to identify the ostium of any of the feeding arteries. Coil embolization proceeded with small  volume (2000 units) of thrombin infused at the conclusion for stasis. After embolization no further flow into the sac was identified. Contrast was static on the final injection. Final cavogram after removal of the 5 [place] trans caval catheter demonstrates no irregularity. Final spin CT demonstrates no extraluminal contrast, with static contrast within the aneurysm sac. IMPRESSION: Status post ultrasound guided access right common femoral vein, right IJ turn jugular vein, and direct percutaneous access of the aneurysm sac, 4 coil and thrombin embolization of the persisting type 2 endoleak. There is a potential type 1 endoleak, with contrast extending above the margin of the superior margin of the [employer] device. Signed, Dulcy Fanny. Dellia Nims, RPVI Vascular and Interventional Radiology Specialists [facility] Radiology Electronically Signed   By: Corrie Mckusick D.O.   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 The antibiotic was administered within 1 hour of the procedure  ANESTHESIA/SEDATION: General endotracheal tube anesthesia The patient's level of consciousness and vital signs were monitored continuously by radiology nursing as well as the anesthesia team throughout the procedure under my direct supervision. CONTRAST:  75m OMNIPAQUE IOHEXOL 300 MG/ML  SOLN FLUOROSCOPY TIME:  Fluoroscopy Time: 28 minutes 30 seconds (1981 mGy). COMPLICATIONS: None PROCEDURE: Informed consent was obtained from the patient following explanation of the procedure, risks, benefits and alternatives. The patient understands, agrees and consents for the procedure. All questions were addressed. A time out was performed prior to the initiation of the procedure. Maximal barrier sterile technique utilized including caps, mask, sterile gowns, sterile gloves, large sterile drape, hand hygiene, and Betadine prep. Patient is brought to the interventional suite. General anesthesia was induced by the anesthesia team. The patient was positioned supine on the fluoroscopy table. The bilateral inguinal region, the anterior abdomen, and the right neck were prepped and draped in the usual sterile fashion. Ultrasound-guided access was then performed with placement of a 5 French sheath into the saphenofemoral junction. Bentson wire was advanced into the IVC, and IVC cavogram was performed. Bentson wire was placed and then a 9 French sheath was placed into the vein. The IVUS catheter was advanced onto the wire and advanced to the hepatic IVC. Pullback intravascular ultrasound was performed. We then used micropuncture access and ultrasound guidance to place a guidewire into the right internal jugular vein. A 10 French sheath was placed, from the [employer] The sheath was placed into the infrarenal IVC, and the cannula and 5 French catheter were then brought to the end of the sheath. Using both intravascular ultrasound and fluoroscopic guidance, trans venous access into the sac performed. With the 5 French catheter  within the sac, sacography was performed. No flow was identified on the right aspect of the partially excluded aneurysm sac. Given that there was no flow, a flat panel CT was performed to confirm location within the sac. Using the flat panel CT, anterior abdominal puncture was then performed targeting the left aspect of the sac, in and known region of flow on the prior CT. A 15 cm 18 gauge needle trocar was advanced. With the  needle in place the stylet was removed and arterial blood was returned. Sacography was again performed. A check flow valve was placed on the back end of the 18 gauge trocar, and microcatheter microwire combination advanced into the sac. We spent a significant time navigating through the sac in attempt to place the microcatheter into the ostium of the feeding arteries. Ultimately we elected to proceed with coil embolization. Coil embolization was then performed within the flow channel in the lower excluded sac. Repeat digital subtraction imaging revealed slower flow at the completion of coil embolization. Diluted thrombin was then infused, with a total of 2000 units slowly infused into the sac. Final angiogram reveals no continuous flow, with no identifiable arterial ostium or outflow, and static contrast within the sac. Final cavogram was performed after withdrawal of the 5 French catheter from the trans caval access. The venous access were removed with manual pressure for hemostasis. Sterile bandages were placed. The patient tolerated the procedure well and remained hemodynamically stable throughout. No complications were encountered no significant blood loss FINDINGS: Ultrasound survey is demonstrate patent right IJ vein and patent right common femoral vein. Cavagram demonstrates no irregularity of the IVC Initial trans caval access into the sac demonstrates no cross flow, with no identification of any feeding vasculature, with the contrast static within the partially excluded sac.  Transabdominal puncture demonstrates large component of patent excluded sac on the left aspect of the sac. There are segmental arteries perfusing from above and below the prior embolization which include median sacral artery below and a combination of left and right lumbar arteries above. The IMA origin was not identified. Injection from the original needle placement demonstrates contrast flowing above the level of the endograft in the abdominal aorta, potentially representing a type 1 endoleak. Navigating the microcatheter within the aneurysm sac was unable to identify the ostium of any of the feeding arteries. Coil embolization proceeded with small volume (2000 units) of thrombin infused at the conclusion for stasis. After embolization no further flow into the sac was identified. Contrast was static on the final injection. Final cavogram after removal of the 5 [place] trans caval catheter demonstrates no irregularity. Final spin CT demonstrates no extraluminal contrast, with static contrast within the aneurysm sac. IMPRESSION: Status post ultrasound guided access right common femoral vein, right IJ turn jugular vein, and direct percutaneous access of the aneurysm sac, 4 coil and thrombin embolization of the persisting type 2 endoleak. There is a potential type 1 endoleak, with contrast extending above the margin of the superior margin of the [employer] device. Signed, Dulcy Fanny. Dellia Nims, RPVI Vascular and Interventional Radiology Specialists [facility] Radiology Electronically Signed   By: Corrie Mckusick D.O.   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 The antibiotic was administered within 1 hour of the procedure ANESTHESIA/SEDATION: General endotracheal tube anesthesia The patient's level of consciousness and vital signs were monitored continuously by radiology nursing as well as the anesthesia team throughout the procedure under my direct supervision. CONTRAST:  80m OMNIPAQUE IOHEXOL 300 MG/ML  SOLN FLUOROSCOPY TIME:  Fluoroscopy Time: 28 minutes 30 seconds (1981 mGy). COMPLICATIONS: None PROCEDURE: Informed consent was obtained from the patient following explanation of the procedure, risks, benefits and alternatives. The patient understands, agrees and consents for the procedure. All questions were addressed. A time out was performed prior to the initiation of the procedure. Maximal barrier sterile technique utilized including caps, mask, sterile gowns, sterile gloves, large sterile drape, hand hygiene, and Betadine prep. Patient is brought to the interventional suite. General anesthesia was induced by the anesthesia team. The patient was positioned supine on the fluoroscopy table. The bilateral inguinal region, the anterior abdomen, and the right neck were prepped and draped in the usual sterile fashion. Ultrasound-guided access was then performed with placement of a 5 French sheath into the saphenofemoral junction. Bentson wire was advanced into the IVC, and IVC cavogram was performed. Bentson wire was placed and then a 9 French sheath was placed into the vein. The IVUS catheter was advanced onto the wire and advanced to the hepatic IVC. Pullback intravascular ultrasound was performed. We then used micropuncture access and ultrasound guidance to place a guidewire into the right internal jugular vein. A 10 French sheath was placed, from the [employer]. The sheath was placed into the infrarenal IVC, and the  cannula and 5 French catheter were then brought to the end of the sheath. Using both intravascular ultrasound and fluoroscopic guidance, trans venous access into the sac performed. With the 5 French catheter within the sac, sacography was performed. No flow was identified on the right aspect of the partially excluded aneurysm sac. Given that there was no flow, a flat panel CT was performed to confirm location within the sac. Using the flat panel CT, anterior abdominal puncture was then performed targeting the left aspect of the sac, in and known region of flow on the prior CT. A 15 cm 18 gauge needle trocar was advanced. With the needle in place the stylet was removed and arterial blood was returned. Sacography was again performed. A check flow valve was placed on the back end of the 18 gauge trocar, and microcatheter microwire combination advanced into the sac. We spent a significant time navigating through the sac in attempt to place the microcatheter into the ostium of the feeding arteries. Ultimately we elected to proceed with coil embolization. Coil embolization was then performed within the flow channel in the lower excluded sac. Repeat digital subtraction imaging revealed slower flow at the completion of coil embolization. Diluted thrombin was then infused, with a total of 2000 units slowly infused into the sac. Final angiogram reveals no continuous flow, with no identifiable arterial ostium or outflow, and static contrast within the sac. Final cavogram was performed after withdrawal of the 5 French catheter from the trans caval access. The venous access were removed with manual pressure for hemostasis. Sterile bandages were placed. The patient tolerated the procedure well and remained hemodynamically stable throughout. No complications were encountered no significant blood loss FINDINGS: Ultrasound survey is demonstrate patent right IJ vein and patent right common femoral vein. Cavagram demonstrates no  irregularity of the IVC Initial trans caval access into the sac demonstrates no cross flow, with no identification of any feeding vasculature, with the contrast static within the partially excluded sac. Transabdominal puncture demonstrates large component of patent excluded sac on the left aspect of the sac. There are segmental arteries perfusing from above and below the prior embolization which include median sacral artery below and a combination of left and right lumbar  arteries above. The IMA origin was not identified. Injection from the original needle placement demonstrates contrast flowing above the level of the endograft in the abdominal aorta, potentially representing a type 1 endoleak. Navigating the microcatheter within the aneurysm sac was unable to identify the ostium of any of the feeding arteries. Coil embolization proceeded with small volume (2000 units) of thrombin infused at the conclusion for stasis. After embolization no further flow into the sac was identified. Contrast was static on the final injection. Final cavogram after removal of the 5 [place] trans caval catheter demonstrates no irregularity. Final spin CT demonstrates no extraluminal contrast, with static contrast within the aneurysm sac. IMPRESSION: Status post ultrasound guided access right common femoral vein, right IJ turn jugular vein, and direct percutaneous access of the aneurysm sac, 4 coil and thrombin embolization of the persisting type 2 endoleak. There is a potential type 1 endoleak, with contrast extending above the margin of the superior margin of the [employer] device. Signed, Dulcy Fanny. Dellia Nims, RPVI Vascular and Interventional Radiology Specialists [facility] Radiology Electronically Signed   By: Corrie Mckusick D.O.   On: 05/13/2019 16:33   Ir Embo Arterial Not [place] Guide Roadmapping  Result Date: 05/13/2019 INDICATION: 85 year old male with a history of prior endograft repair with expanding aneurysm  and a known type 2 endoleak EXAM: ULTRASOUND GUIDED ACCESS RIGHT COMMON FEMORAL VEIN ULTRASOUND GUIDED ACCESS RIGHT INTERNAL JUGULAR VEIN CAVAGRAM IVUS EVALUATION OF THE IVC WITH INTRAVASCULAR ULTRASOUND GUIDED TRANS VENOUS SAC ACCESS CT-GUIDED PERCUTANEOUS SAC ACCESS EMBOLIZATION OF TYPE 2 ENDOLEAK WITH COIL EMBOLIZATION AND THROMBIN MEDICATIONS: 2 g Ancef. The antibiotic was administered within 1 hour of the procedure ANESTHESIA/SEDATION: General endotracheal tube anesthesia The patient's level of consciousness and vital signs were monitored continuously by radiology nursing as well as the anesthesia team throughout the procedure under my direct supervision. CONTRAST:  40m OMNIPAQUE IOHEXOL 300 MG/ML  SOLN FLUOROSCOPY TIME:  Fluoroscopy Time: 28 minutes 30 seconds (1981 mGy). COMPLICATIONS: None PROCEDURE: Informed consent was obtained from the patient following explanation of the procedure, risks, benefits and alternatives. The patient understands, agrees and consents for the procedure. All questions were addressed. A time out was performed prior to the initiation of the procedure. Maximal barrier sterile technique utilized including caps, mask, sterile gowns, sterile gloves, large sterile drape, hand hygiene, and Betadine prep. Patient is brought to the interventional suite. General anesthesia was induced by the anesthesia team. The patient was positioned supine on the fluoroscopy table. The bilateral inguinal region, the anterior abdomen, and the right neck were prepped and draped in the usual sterile fashion. Ultrasound-guided access was then performed with placement of a 5 French sheath into the saphenofemoral junction. Bentson wire was advanced into the IVC, and IVC cavogram was performed. Bentson wire was placed and then a 9 French sheath was placed into the vein. The IVUS catheter was advanced onto the wire and advanced to the hepatic IVC. Pullback intravascular ultrasound was performed. We then used  micropuncture access and ultrasound guidance to place a guidewire into the right internal jugular vein. A 10 French sheath was placed, from the [employer] The sheath was placed into the infrarenal IVC, and the cannula and 5 French catheter were then brought to the end of the sheath. Using both intravascular ultrasound and fluoroscopic guidance, trans venous access into the sac performed. With the 5 French catheter within the sac, sacography was performed. No flow was identified on the right aspect of the partially  excluded aneurysm sac. Given that there was no flow, a flat panel CT was performed to confirm location within the sac. Using the flat panel CT, anterior abdominal puncture was then performed targeting the left aspect of the sac, in and known region of flow on the prior CT. A 15 cm 18 gauge needle trocar was advanced. With the needle in place the stylet was removed and arterial blood was returned. Sacography was again performed. A check flow valve was placed on the back end of the 18 gauge trocar, and microcatheter microwire combination advanced into the sac. We spent a significant time navigating through the sac in attempt to place the microcatheter into the ostium of the feeding arteries. Ultimately we elected to proceed with coil embolization. Coil embolization was then performed within the flow channel in the lower excluded sac. Repeat digital subtraction imaging revealed slower flow at the completion of coil embolization. Diluted thrombin was then infused, with a total of 2000 units slowly infused into the sac. Final angiogram reveals no continuous flow, with no identifiable arterial ostium or outflow, and static contrast within the sac. Final cavogram was performed after withdrawal of the 5 French catheter from the trans caval access. The venous access were removed with manual pressure for hemostasis. Sterile bandages were placed. The patient tolerated the procedure well and remained  hemodynamically stable throughout. No complications were encountered no significant blood loss FINDINGS: Ultrasound survey is demonstrate patent right IJ vein and patent right common femoral vein. Cavagram demonstrates no irregularity of the IVC Initial trans caval access into the sac demonstrates no cross flow, with no identification of any feeding vasculature, with the contrast static within the partially excluded sac. Transabdominal puncture demonstrates large component of patent excluded sac on the left aspect of the sac. There are segmental arteries perfusing from above and below the prior embolization which include median sacral artery below and a combination of left and right lumbar arteries above. The IMA origin was not identified. Injection from the original needle placement demonstrates contrast flowing above the level of the endograft in the abdominal aorta, potentially representing a type 1 endoleak. Navigating the microcatheter within the aneurysm sac was unable to identify the ostium of any of the feeding arteries. Coil embolization proceeded with small volume (2000 units) of thrombin infused at the conclusion for stasis. After embolization no further flow into the sac was identified. Contrast was static on the final injection. Final cavogram after removal of the 5 [place] trans caval catheter demonstrates no irregularity. Final spin CT demonstrates no extraluminal contrast, with static contrast within the aneurysm sac. IMPRESSION: Status post ultrasound guided access right common femoral vein, right IJ turn jugular vein, and direct percutaneous access of the aneurysm sac, 4 coil and thrombin embolization of the persisting type 2 endoleak. There is a potential type 1 endoleak, with contrast extending above the margin of the superior margin of the [employer] device. Signed, Dulcy Fanny. Dellia Nims, RPVI [facility] Radiology Electronically Signed   By:  Corrie Mckusick D.O.   On: 05/13/2019 16:33    Labs:  CBC: Recent Labs    05/13/19 0630  WBC 6.3  HGB 14.8  HCT 43.1  PLT 203    COAGS: Recent Labs    05/13/19 0630  INR 1.1    BMP: Recent Labs    05/13/19 0630  NA 139  K 4.1  CL 103  CO2 26  GLUCOSE 103*  BUN 17  CALCIUM 9.9  CREATININE 1.29*  GFRNONAA 51*  GFRAA 59*    LIVER FUNCTION TESTS: No results for input(s): BILITOT, AST, ALT, ALKPHOS, PROT, ALBUMIN in the last 8760 hours.  TUMOR MARKERS: No results for input(s): AFPTM, CEA, CA199, CHROMGRNA in the last 8760 hours.  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   Since his latest embolization, performed 05/13/2019 at [facility], he is feeling very well, with no concerns, and his CT shows a stable size of the sac, and no concerning features.   Today during our conversation I emphasized the need for ongoing surveillance, and we will schedule another non-contrast CT in 6 months with tele-health visit.  He understands and agrees.   He will also be following up with his Urologist for phimosis.    He knows that should he have any acute changes, such as abdominal pain, he should seek help at the ED.   Plan: - Repeat non-contrast CT abd/pelvis with office visit/tele-health visit in 6 months.   - Continue current care - I have encouraged him to observe all his future doctors appointments, including Urology.    Electronically Signed: Corrie Mckusick 06/10/2019, 8:46 AM   I spent a total of    25 Minutes in remote  clinical consultation, greater than 50% of which was counseling/coordinating care for type II endoleak, enlarging sac, SP embolization.    Visit type: Audio only (telephone). Audio (no video) only due to no access. Alternative for in-person consultation at [facility], [address], [place], [place]. This visit type was  conducted due to national recommendations for restrictions regarding the COVID-19 Pandemic (e.g. social distancing).  This format is felt to be most appropriate for this patient at this time.  All issues noted in this document were discussed and addressed.

## 2019-11-26 ENCOUNTER — Other Ambulatory Visit: Payer: Self-pay | Admitting: Interventional Radiology

## 2019-11-26 ENCOUNTER — Other Ambulatory Visit: Payer: Self-pay

## 2019-11-26 DIAGNOSIS — T82330A Leakage of aortic (bifurcation) graft (replacement), initial encounter: Secondary | ICD-10-CM

## 2019-11-26 DIAGNOSIS — IMO0002 Reserved for concepts with insufficient information to code with codable children: Secondary | ICD-10-CM

## 2020-02-11 ENCOUNTER — Other Ambulatory Visit: Payer: Self-pay | Admitting: Interventional Radiology

## 2020-02-11 DIAGNOSIS — IMO0002 Reserved for concepts with insufficient information to code with codable children: Secondary | ICD-10-CM

## 2020-02-11 DIAGNOSIS — I714 Abdominal aortic aneurysm, without rupture, unspecified: Secondary | ICD-10-CM

## 2020-02-11 DIAGNOSIS — T82330A Leakage of aortic (bifurcation) graft (replacement), initial encounter: Secondary | ICD-10-CM

## 2020-03-09 ENCOUNTER — Other Ambulatory Visit: Payer: Self-pay

## 2020-03-09 ENCOUNTER — Encounter: Payer: Self-pay | Admitting: *Deleted

## 2020-03-09 ENCOUNTER — Ambulatory Visit
Admission: RE | Admit: 2020-03-09 | Discharge: 2020-03-09 | Disposition: A | Discharge status: Home / Self Care | Payer: Medicare Other | Source: Ambulatory Visit | Attending: Interventional Radiology | Admitting: Interventional Radiology

## 2020-03-09 DIAGNOSIS — I714 Abdominal aortic aneurysm, without rupture, unspecified: Secondary | ICD-10-CM

## 2020-03-09 DIAGNOSIS — IMO0002 Reserved for concepts with insufficient information to code with codable children: Secondary | ICD-10-CM

## 2020-03-09 HISTORY — PX: IR RADIOLOGIST EVAL & MGMT: IMG5224

## 2020-03-09 NOTE — Progress Notes (Signed)
Chief Complaint: Aortic Endograft Type II Endoleak, treated  Referring Physician(s): Dr. Denyce Robert  History of Present Illness: Jimmy Burke is a 86 y.o. male presenting as a scheduled follow up to [place] today, now SP his second late intervention for a persisting type II endoleak, and enlarging aneurysm sac.   Our most recent treatment was performed 05/13/2019.   We saw him last in the office 06/10/2019.   I spoke with Jimmy Burke and his daughter today via telemedicine visit, and confirmed his identity with 2 personal identifiers.   History: He is a gentleman who is known to our service, with a history of AAA repair at [facility] with Dr. Maryjean Morn.  This was performed with infrarenal fixation with a Gore Endograft in April of 2015.  He was first treated for a persisting endoleak and enlarging sac by my late [place] partner, Dr. Barbie Banner, with embolization of contributing lumbar arteries 11/27/2016.    There was relative stability after the treatment, though ultimately the sac enlarged to about 7.9cm prompting [place] to re-treat him.  This most recent treatment was performed at [facility] [place] 04/2019 with the use of IVUS, and both a trans-caval approach and trans-abdominal approach.  Combination of coil embolization and direct thrombin injection was used.   Interval History: Since our last visit, he tells me that he has done just fine, and has stayed healthy.  No interval hospitalizations or ED visits.    He denies any symptoms of abdominal pain, claudication, or wounds.   He seems to be very satisfied with his quality of life now, and tries to exercise every day.    He is very Patent attorney of our service.    CT performed at [facility] 03/04/20.  This is compared to post-treatment CT of 06/08/2019.  By my measurement, the largest AP diameter on the previous is ~7cm, and the diameter on the current is ~6.4cm.  Largest diagonal diameter previously was 7.7cm, today 7.2cm. Thus, the sac continues to get  smaller over time.   There are no secondary concerning signs such as inflammation or edema.     Past Medical History:  Diagnosis Date  . Atrial fibrillation ([place])   . Chronic anticoagulation   . Endoleak post (EVAR) endovascular aneurysm repair ([place])   . Full dentures   . Hypertension     Past Surgical History:  Procedure Laterality Date  . ABDOMINAL AORTIC ANEURYSM REPAIR    . CHOLECYSTECTOMY    . EYE SURGERY     Bilateral cataract surgery    . HERNIA REPAIR Right    inguinal hernia  . IR AORTAGRAM ABDOMINAL SERIALOGRAM  05/13/2019  . IR CT SPINE LTD  05/13/2019  . IR EMBO ARTERIAL NOT HEMORR HEMANG INC GUIDE ROADMAPPING  05/13/2019  . IR GENERIC HISTORICAL  05/02/2016   IR RADIOLOGIST EVAL & MGMT 05/02/2016 Marybelle Killings, MD [facility] INTERV RAD  . IR GENERIC HISTORICAL  11/15/2016   IR RADIOLOGIST EVAL & MGMT 11/15/2016 Marybelle Killings, MD [facility] INTERV RAD  . IR INTRAVASCULAR ULTRASOUND NON CORONARY  05/13/2019  . IR RADIOLOGIST EVAL & MGMT  02/06/2017  . IR RADIOLOGIST EVAL & MGMT  05/16/2017  . IR RADIOLOGIST EVAL & MGMT  12/03/2017  . IR RADIOLOGIST EVAL & MGMT  05/04/2019  . IR RADIOLOGIST EVAL & MGMT  06/10/2019  . IR US GUIDE VASC ACCESS RIGHT  05/13/2019  . IR US GUIDE VASC ACCESS RIGHT  05/13/2019  . IR VENOCAVAGRAM IVC  05/13/2019  . MULTIPLE  TOOTH EXTRACTIONS    . RADIOLOGY WITH ANESTHESIA N/A 05/13/2019   Procedure: TYPE 2 ENDOLEAK;  Surgeon: Radiologist, Medication, MD;  Location: [place];  Service: Radiology;  Laterality: N/A;    Allergies: Patient has no known allergies.  Medications: Prior to Admission medications   Medication Sig Start Date End Date Taking? Authorizing Provider  apixaban (ELIQUIS) 5 MG TABS tablet Take 5 mg by mouth daily.    [provider]  aspirin 81 MG tablet Take 81 mg by mouth daily.    [provider]  cholecalciferol (VITAMIN D3) 25 MCG (1000 UT) tablet Take 1,000 Units by mouth daily.    [provider]  desonide (DESOWEN)  0.05 % cream Apply 1 application topically daily as needed (irritation, dry skin).    [provider]  diltiazem (CARDIZEM CD) 120 MG 24 hr capsule Take 120 mg by mouth daily. 04/28/19   [provider]  donepezil (ARICEPT) 5 MG tablet Take 5 mg by mouth daily.     [provider]  ketoconazole (NIZORAL) 2 % cream Apply 1 application topically daily as needed for irritation (dry skin).     [provider]  metoprolol succinate (TOPROL-XL) 100 MG 24 hr tablet Take 100 mg by mouth daily. Take with or immediately following a meal.    [provider]  Multiple Vitamin (MULTIVITAMIN WITH MINERALS) TABS tablet Take 1 tablet by mouth daily.    [provider]  simvastatin (ZOCOR) 20 MG tablet Take 20 mg by mouth daily.    [provider]     No family history on file.  Social History   Socioeconomic History  . Marital status: Widowed    Spouse name: Not on file  . Number of children: Not on file  . Years of education: Not on file  . Highest education level: Not on file  Occupational History  . Not on file  Tobacco Use  . Smoking status: Former Smoker    Types: Cigarettes  . Smokeless tobacco: Never Used  . Tobacco comment: quit smoking cigarettes in 1990's  Substance and Sexual Activity  . Alcohol use: Not Currently    Alcohol/week: 0.0 standard drinks  . Drug use: No  . Sexual activity: Not on file  Other Topics Concern  . Not on file  Social History Narrative  . Not on file   Social Determinants of Health   Financial Resource Strain:   . Difficulty of Paying Living Expenses:   Food Insecurity:   . Worried About Charity fundraiser in the Last Year:   . Arboriculturist in the Last Year:   Transportation Needs:   . Film/video editor (Medical):   Marland Kitchen Lack of Transportation (Non-Medical):   Physical Activity:   . Days of Exercise per Week:   . Minutes of Exercise per Session:   Stress:   . Feeling of Stress :     Social Connections:   . Frequency of Communication with Friends and Family:   . Frequency of Social Gatherings with Friends and Family:   . Attends Religious Services:   . Active Member of Clubs or Organizations:   . Attends Archivist Meetings:   Marland Kitchen Marital Status:        Review of Systems  Review of Systems: A 12 point ROS discussed and pertinent positives are indicated in the HPI above.  All other systems are negative.  Physical Exam No direct physical exam was performed (except  for noted visual exam findings with Video Visits).    Vital Signs: There were no vitals taken for this visit.  Imaging: No results found.  Labs:  CBC: Recent Labs    05/13/19 0630  WBC 6.3  HGB 14.8  HCT 43.1  PLT 203    COAGS: Recent Labs    05/13/19 0630  INR 1.1    BMP: Recent Labs    05/13/19 0630  NA 139  K 4.1  CL 103  CO2 26  GLUCOSE 103*  BUN 17  CALCIUM 9.9  CREATININE 1.29*  GFRNONAA 51*  GFRAA 59*    LIVER FUNCTION TESTS: No results for input(s): BILITOT, AST, ALT, ALKPHOS, PROT, ALBUMIN in the last 8760 hours.  TUMOR MARKERS: No results for input(s): AFPTM, CEA, CA199, CHROMGRNA in the last 8760 hours.  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   Since our last visit, he is feeling very well, with no concerns, and his CT shows decreasing size of the aneurysm sac with no concerning features.   Today during our conversation we discussed mainly surveillance, and we will schedule another non-contrast CT and office visit in ~12 months from now.     He knows that should he have any acute changes, such as abdominal pain, he should seek help at the ED.   Plan: - Repeat non-contrast CT abd/pelvis with office visit/tele-health visit in 12 months  - Continue current care   Electronically Signed: Corrie Mckusick 03/09/2020, 9:05  AM   I spent a total of    25 Minutes in remote  clinical consultation, greater than 50% of which was counseling/coordinating care for treatment of Type II endoleak after EVAR.    Visit type: Audio only (telephone). Audio (no video) only due to patient's lack of internet/smartphone capability. Alternative for in-person consultation at [facility], [address], [place], [place]. This visit type was conducted due to national recommendations for restrictions regarding the COVID-19 Pandemic (e.g. social distancing).  This format is felt to be most appropriate for this patient at this time.  All issues noted in this document were discussed and addressed.

## 2021-01-26 ENCOUNTER — Encounter: Payer: Self-pay | Admitting: Internal Medicine

## 2021-01-26 ENCOUNTER — Ambulatory Visit (INDEPENDENT_AMBULATORY_CARE_PROVIDER_SITE_OTHER): Payer: Medicare Other | Admitting: Internal Medicine

## 2021-01-26 ENCOUNTER — Ambulatory Visit (INDEPENDENT_AMBULATORY_CARE_PROVIDER_SITE_OTHER): Payer: Medicare Other

## 2021-01-26 ENCOUNTER — Other Ambulatory Visit: Payer: Self-pay

## 2021-01-26 VITALS — BP 114/60 | HR 101 | Temp 98.0°F | Ht 66.0 in | Wt 110.0 lb

## 2021-01-26 DIAGNOSIS — Z8616 Personal history of COVID-19: Secondary | ICD-10-CM

## 2021-01-26 DIAGNOSIS — J449 Chronic obstructive pulmonary disease, unspecified: Secondary | ICD-10-CM | POA: Diagnosis not present

## 2021-01-26 DIAGNOSIS — J9611 Chronic respiratory failure with hypoxia: Secondary | ICD-10-CM | POA: Diagnosis not present

## 2021-01-26 DIAGNOSIS — R911 Solitary pulmonary nodule: Secondary | ICD-10-CM

## 2021-01-26 DIAGNOSIS — J90 Pleural effusion, not elsewhere classified: Secondary | ICD-10-CM | POA: Diagnosis not present

## 2021-01-26 DIAGNOSIS — R0789 Other chest pain: Secondary | ICD-10-CM

## 2021-01-26 LAB — CBC WITH DIFFERENTIAL/PLATELET
Basophils Absolute: 0 10*3/uL (ref 0.0–0.1)
Basophils Relative: 0.4 % (ref 0.0–3.0)
Eosinophils Absolute: 0 10*3/uL (ref 0.0–0.7)
Eosinophils Relative: 0.3 % (ref 0.0–5.0)
HCT: 34 % — ABNORMAL LOW (ref 39.0–52.0)
Hemoglobin: 10.6 g/dL — ABNORMAL LOW (ref 13.0–17.0)
Lymphocytes Relative: 11 % — ABNORMAL LOW (ref 12.0–46.0)
Lymphs Abs: 1.1 10*3/uL (ref 0.7–4.0)
MCHC: 31.1 g/dL (ref 30.0–36.0)
MCV: 78 fl (ref 78.0–100.0)
Monocytes Absolute: 0.9 10*3/uL (ref 0.1–1.0)
Monocytes Relative: 8.8 % (ref 3.0–12.0)
Neutro Abs: 7.9 10*3/uL — ABNORMAL HIGH (ref 1.4–7.7)
Neutrophils Relative %: 79.5 % — ABNORMAL HIGH (ref 43.0–77.0)
Platelets: 275 10*3/uL (ref 150.0–400.0)
RBC: 4.36 Mil/uL (ref 4.22–5.81)
RDW: 22 % — ABNORMAL HIGH (ref 11.5–15.5)
WBC: 10 10*3/uL (ref 4.0–10.5)

## 2021-01-26 LAB — PROTIME-INR
INR: 1.1 ratio — ABNORMAL HIGH (ref 0.8–1.0)
Prothrombin Time: 12.6 s (ref 9.6–13.1)

## 2021-01-26 LAB — APTT: aPTT: 25.4 s (ref 23.4–32.7)

## 2021-01-26 NOTE — Patient Instructions (Addendum)
Pleural effusion, right  -  clinically there appears to be moderate right-sided pleural effusion based on physical exam today and CT scan of the chest 01/16/2021  Plan  - chest x-ray two-view today -CBC, PT PTT today -Refer interventional radiology for right-sided thoracentesis  -Aim to do it in the next few to several days  The fluid removal will be done by Interventional radiology   - they will remove not more than 1.5L  - fluid labs to be sent: cell count, gram stain and culture, cytology for malignant cells, chemistries for LDH, albumin,  Protein, glucose, triglyceride and lipase  - blood labs to be sent on same day / time: cbc, ldh, protein, albumin glucose, and lipase  -Given high risk physician assistant or MD to do the procedure  -You will have to stop Eliquis 2 days prior to procedure   Nodule of lower lobe of left lung 2 cm in the left lower lobe superior segment  -Suspicious for lung cancer  Plan  -Do PET scan after thoracentesis -Sign release to get the CD-ROM of the CT scan from [facility] [done on 01/16/2021]  COPD, severe ([place]) Chronic respiratory failure with hypoxia ([place])  -Stable without any flareup  Plan -Continue oxygen therapy -Continue Trelegy as before -At some point can get arterial blood gas and pulmonary function testing  Personal hx of covid - 6 months agp  Plan  =- cjheck blood Covid IgG  Followup  -Return to see Dr. Leory Plowman Icard or Dr. Baltazar Apo in the next few to several weeks but after completing the above procedures

## 2021-01-26 NOTE — Progress Notes (Signed)
OV 01/26/2021  Subjective:  Patient ID: Jimmy Burke, male , DOB: Jan 02, 1933 , age 87 y.o. , MRN: [id number] , ADDRESS: [address] [place] [address] PCP Houston Siren., MD Patient Care Team: Houston Siren., MD as PCP - General (Family Medicine)  This Provider for this visit: Treatment Team:  Attending Provider: Brand Males, MD    01/26/2021 -   Chief Complaint  Patient presents with  . Consult    SOB, dry coughing and wheezing for 6 months     HPI Jimmy Burke 87 y.o. - New consult referred by Dr. Malen Gauze with [facility].  Patient is accompanied by his daughter.  History is obtained from the patient, his daughter and review of the records. Per PCP Note 01/20/21: "2 week follow up:  Shortness of breath/COPD: Two weeks ago, at most recent office visit, patient was given samples of inhaler (Trelegy/Anora), was given Kenalog IM injection in office, and oral steroids. CT scan was ordered. CT Scan Results:  Chest CT shows some fluid under right lung, could be part of reason for increased SOB. He also has a small nodule in left lung, but not cause of SOB, still needs to be checked out. Please get Pulmonary consult for Right pleural effusion and left lung nodule"   He has a known diagnosis of COPD not otherwise specified.  He quit smoking many years ago.  Unclear what his baseline inhaler was but is on Trelegy only since March 2022. According to the patient approximately 6 months ago he suddenly developed what he thought as Covid symptoms of loss of taste loss of appetite diarrhea and extreme fatigue.  He could not get out of bed.  He is self diagnosed with Covid and then recovered.  No formal testing available.  After that he underwent physical therapy.  He says all along he was short of breath.  Then by March 2022 hypoxemia and wheezing was discovered.  At this time according to the notes and the patient primary care physician started him on oxygen which is now  started 3 weeks ago.  He was also given steroid and started on Trelegy.  This is confirmed with the documentation.  CT scan of the chest was ordered.  I am unable to see the image but shows right-sided pleural effusion and left-sided left lower lobe superior segment greater than 2 cm spiculated nodule.  Therefore he has been referred here.  He gives a history of ankle fracture.  Today he arrived on 2 L nasal cannula and CMA turned his oxygen to room air and immediately desaturated to 89%.  Overall he is lost weight and is deconditioned.  He says his overall quality of life is poor because of all this  He is noted to be on Eliquis and Trelegy  No flowsheet data found.      CT Chest data 4.01/2021 - read only report. No image avail. Done at [facility]  EXAM:  CT CHEST WITHOUT CONTRAST   TECHNIQUE:  Multidetector CT imaging of the chest was performed following the  standard protocol without IV contrast.   COMPARISON: None.   FINDINGS:  Cardiovascular: Cardiac enlargement. Aortic atherosclerosis.  Coronary artery atherosclerotic calcification.   Mediastinum/Nodes: No supraclavicular, axillary, mediastinal the  lymph nodes. Hilar nodes are suboptimally evaluated due to lack of  IV contrast material.   Lungs/Pleura: Moderate to advanced changes of centrilobular and  paraseptal emphysema. Moderate size right pleural effusion is noted.  Subsegmental  atelectasis is also noted within the posterior right  middle lobe, image 103/4. Spiculated lesion is identified within the  superior segment of left lower lobe worrisome for primary  bronchogenic carcinoma. This measures 2 x 1.6 cm, image 74/4.  Subpleural nodular density overlying the lateral right upper lobe  measures 0.9 x 0.5 cm.   Upper Abdomen: No acute abnormality. Pneumobilia is noted compatible  with biliary patency. Previous cholecystectomy. Aortic  atherosclerosis.   Musculoskeletal: Diffuse osteopenia. Ankylosis of the  thoracic spine  is identified. No acute or suspicious osseous findings.   IMPRESSION:  1. Suspicious, spiculated nodule is identified within the superior  segment of left lower lobe. Further evaluation with PET-CT is  recommended.  2. Cardiac enlargement, aortic atherosclerosis and multi vessel  coronary artery calcifications.A moderate size right pleural  effusion is noted without interstitial edema. Correlate for any  clinical signs or symptoms of CHF.   These results will be called to the ordering clinician or  representative by the Radiologist Assistant, and communication  documented in the PACS or [employer].    Electronically Signed  By: Kerby Moors M.D.  On: 01/16/2021 10:29  Blood wior at [facility]  -Only limited chemistry profile available on January 06, 2021.  No coagulation results available.  BUN 31 creatinine 1 mg percent.    -Prior to that CBC was done in August 2021  has a past medical history of Atrial fibrillation ([place]), Chronic anticoagulation, Endoleak post (EVAR) endovascular aneurysm repair, Full dentures, and Hypertension.   reports that he has quit smoking. His smoking use included cigarettes. He has never used smokeless tobacco.  Past Surgical History:  Procedure Laterality Date  . ABDOMINAL AORTIC ANEURYSM REPAIR    . CHOLECYSTECTOMY    . EYE SURGERY     Bilateral cataract surgery    . HERNIA REPAIR Right    inguinal hernia  . IR AORTAGRAM ABDOMINAL SERIALOGRAM  05/13/2019  . IR CT SPINE LTD  05/13/2019  . IR EMBO ARTERIAL NOT HEMORR HEMANG INC GUIDE ROADMAPPING  05/13/2019  . IR GENERIC HISTORICAL  05/02/2016   IR RADIOLOGIST EVAL & MGMT 05/02/2016 Marybelle Killings, MD [facility] INTERV RAD  . IR GENERIC HISTORICAL  11/15/2016   IR RADIOLOGIST EVAL & MGMT 11/15/2016 Marybelle Killings, MD [facility] INTERV RAD  . IR INTRAVASCULAR ULTRASOUND NON CORONARY  05/13/2019  . IR RADIOLOGIST EVAL & MGMT  02/06/2017  . IR RADIOLOGIST EVAL & MGMT  05/16/2017  . IR  RADIOLOGIST EVAL & MGMT  12/03/2017  . IR RADIOLOGIST EVAL & MGMT  05/04/2019  . IR RADIOLOGIST EVAL & MGMT  06/10/2019  . IR RADIOLOGIST EVAL & MGMT  03/09/2020  . IR US GUIDE VASC ACCESS RIGHT  05/13/2019  . IR US GUIDE VASC ACCESS RIGHT  05/13/2019  . IR VENOCAVAGRAM IVC  05/13/2019  . MULTIPLE TOOTH EXTRACTIONS    . RADIOLOGY WITH ANESTHESIA N/A 05/13/2019   Procedure: TYPE 2 ENDOLEAK;  Surgeon: Radiologist, Medication, MD;  Location: [place];  Service: Radiology;  Laterality: N/A;    No Known Allergies  Immunization History  Administered Date(s) Administered  . Influenza, High Dose Seasonal PF 07/25/2016, 07/18/2018, 07/09/2019  . Pneumococcal Conjugate-13 12/25/2017  . Pneumococcal Polysaccharide-23 12/31/2018  . Tdap 03/13/2019    History reviewed. No pertinent family history.   Current Outpatient Medications:  .  apixaban (ELIQUIS) 5 MG TABS tablet, Take 5 mg by mouth daily., Disp: , Rfl:  .  aspirin 81 MG tablet, Take 81 mg by  mouth daily., Disp: , Rfl:  .  cholecalciferol (VITAMIN D3) 25 MCG (1000 UT) tablet, Take 1,000 Units by mouth daily., Disp: , Rfl:  .  desonide (DESOWEN) 0.05 % cream, Apply 1 application topically daily as needed (irritation, dry skin)., Disp: , Rfl:  .  diltiazem (CARDIZEM CD) 120 MG 24 hr capsule, Take 120 mg by mouth daily., Disp: , Rfl:  .  diltiazem (TIAZAC) 120 MG 24 hr capsule, Take 1 capsule by mouth daily., Disp: , Rfl:  .  donepezil (ARICEPT) 5 MG tablet, Take 5 mg by mouth daily. , Disp: , Rfl:  .  Fluticasone-Umeclidin-Vilant 100-62.5-25 MCG/INH AEPB, Inhale into the lungs., Disp: , Rfl:  .  hydrochlorothiazide (HYDRODIURIL) 25 MG tablet, TAKE 1/4 TABLET BY MOUTH EVERY DAY AS DIRECTED, Disp: , Rfl:  .  ketoconazole (NIZORAL) 2 % cream, Apply 1 application topically daily as needed for irritation (dry skin). , Disp: , Rfl:  .  metoprolol succinate (TOPROL-XL) 100 MG 24 hr tablet, Take 100 mg by mouth daily. Take with or immediately following a  meal., Disp: , Rfl:  .  Multiple Vitamin (MULTIVITAMIN WITH MINERALS) TABS tablet, Take 1 tablet by mouth daily., Disp: , Rfl:  .  simvastatin (ZOCOR) 20 MG tablet, Take 20 mg by mouth daily., Disp: , Rfl:       Objective:   Vitals:   01/26/21 1058  BP: 114/60  Pulse: (!) 101  Temp: 98 F (36.7 C)  TempSrc: Oral  SpO2: (!) 89%  Weight: 110 lb (49.9 kg)  Height: 5\' 6"  (1.676 m)    Estimated body mass index is 17.75 kg/m as calculated from the following:   Height as of this encounter: 5\' 6"  (1.676 m).   Weight as of this encounter: 110 lb (49.9 kg).  @WEIGHTCHANGE @  [employer]   01/26/21 1058  Weight: 110 lb (49.9 kg)     Physical Exam   General: No distress.  Elderly frail male sitting comfortably but looks deconditioned Neuro: Alert and Oriented x 3. GCS 15. Speech normal Psych: Pleasant Resp:  Barrel Chest - yes with right-sided stony dullness and mild accessory muscle use.  Wheeze - no, Crackles - no, No overt respiratory distress CVS: Normal heart sounds. Murmurs - no Ext: Stigmata of Connective Tissue Disease - no HEENT: Normal upper airway. PEERL +. No post nasal drip        Assessment:       ICD-10-CM   1. Pleural effusion, right  J90   2. Nodule of lower lobe of left lung  R91.1   3. COPD, severe ([place])  J44.9   4. Chronic respiratory failure with hypoxia ([facility])  J96.11   5. Personal history of COVID-19  Z86.16        Plan:     Patient Instructions  Pleural effusion, right  -  clinically there appears to be moderate right-sided pleural effusion based on physical exam today and CT scan of the chest 01/16/2021  Plan  - chest x-ray two-view today -CBC, PT PTT today -Refer interventional radiology for right-sided thoracentesis  -Aim to do it in the next few to several days  The fluid removal will be done by Interventional radiology   - they will remove not more than 1.5L  - fluid labs to be sent: cell count, gram stain and culture, cytology  for malignant cells, chemistries for LDH, albumin,  Protein, glucose, triglyceride and lipase  - blood labs to be sent on same day / time:  cbc, ldh, protein, albumin glucose, and lipase  -Given high risk physician assistant or MD to do the procedure  -You will have to stop Eliquis 2 days prior to procedure   Nodule of lower lobe of left lung 2 cm in the left lower lobe superior segment  -Suspicious for lung cancer  Plan  -Do PET scan after thoracentesis -Sign release to get the CD-ROM of the CT scan from [facility] [done on 01/16/2021]  COPD, severe ([place]) Chronic respiratory failure with hypoxia ([place])  -Stable without any flareup  Plan -Continue oxygen therapy -Continue Trelegy as before -At some point can get arterial blood gas and pulmonary function testing  Personal hx of covid - 6 months agp  Plan  =- cjheck blood Covid IgG  Followup  -Return to see Dr. Leory Plowman Icard or Dr. Baltazar Apo in the next few to several weeks but after completing the above procedures     SIGNATURE    Dr. Brand Males, M.D., F.C.C.P,  Pulmonary and Critical Care Medicine Staff Physician, [place] Director - Interstitial Lung Disease  Program  Pulmonary [place] at [place], [place], [address]  Pager: [phone], If no answer or between  15:00h - 7:00h: call [phone] Telephone: [phone]  11:51 AM 01/26/2021;dd

## 2021-01-27 LAB — IGE: IgE (Immunoglobulin E), Serum: 27 kU/L (ref <=114)

## 2021-01-31 ENCOUNTER — Ambulatory Visit (HOSPITAL_COMMUNITY)
Admission: RE | Admit: 2021-01-31 | Discharge: 2021-01-31 | Disposition: A | Discharge status: Home / Self Care | Payer: Medicare Other | Source: Ambulatory Visit | Attending: Internal Medicine | Admitting: Internal Medicine

## 2021-01-31 DIAGNOSIS — Z01812 Encounter for preprocedural laboratory examination: Secondary | ICD-10-CM | POA: Insufficient documentation

## 2021-01-31 DIAGNOSIS — Z20822 Contact with and (suspected) exposure to covid-19: Secondary | ICD-10-CM | POA: Diagnosis not present

## 2021-01-31 LAB — SARS CORONAVIRUS 2 (TAT 6-24 HRS): SARS Coronavirus 2: NEGATIVE

## 2021-01-31 NOTE — Progress Notes (Signed)
Emil, Also let patient know that he is now anemic compared to 1 year ago on our labs. His PCP Houston Siren., MD  Is with [facility] and maybe they already know about it Please send results to PCP

## 2021-01-31 NOTE — Progress Notes (Signed)
Rt pleural effusion moderate size +. Ensure IR thora -> PET scan and followup with Icard/[facility]. If they are not available then Eric Form

## 2021-02-01 ENCOUNTER — Telehealth: Payer: Self-pay | Admitting: Internal Medicine

## 2021-02-01 NOTE — Telephone Encounter (Signed)
Info documented from pt's recent cxr:   Called and spoke with pt letting him know the results of the cxr and he verbalized understanding. Pt said he is scheduled to have thoracentesis tomorrow 4/21 and then is scheduled to have the PET performed 4/26. Stated to pt that MR wants him to have an appt scheduled with either Dr. Lamonte Sakai or Dr. Valeta Harms to discuss the results of the tests and pt stated for me to call his daughter to get the appt scheduled.  Attempted to call pt's daugher Vickey Huger but unable to reach. Left her a message to call the office so we can get an appt scheduled for pt with either Dr. Lamonte Sakai or Dr. Valeta Harms.  Due to not having DPR on file, since pt did give the verbal that Vickey Huger was needing to be the one to call to get the appt scheduled and since I was unable to reach her and had to leave a VM for her to call back, so there will not be any conflict with getting appt scheduled when she called office, creating a phone encounter.   Pt will need 44min OV with Dr. Lamonte Sakai or Dr. Valeta Harms which needs to be after pt has the PET on 4/26.  Routing to front desk staff so they can help [place] out with this and also since message was left for pt's daughter to call back to get the appt scheduled.

## 2021-02-01 NOTE — Telephone Encounter (Signed)
Saw that pt's daughter called back and pt has been scheduled for a 44min OV with Dr. Lamonte Sakai 5/5. Nothing further needed.

## 2021-02-02 ENCOUNTER — Other Ambulatory Visit (HOSPITAL_COMMUNITY): Payer: Self-pay | Admitting: Physician Assistant

## 2021-02-02 ENCOUNTER — Ambulatory Visit (HOSPITAL_COMMUNITY)
Admission: RE | Admit: 2021-02-02 | Discharge: 2021-02-02 | Disposition: A | Discharge status: Home / Self Care | Payer: Medicare Other | Source: Ambulatory Visit | Attending: Internal Medicine | Admitting: Internal Medicine

## 2021-02-02 ENCOUNTER — Ambulatory Visit (HOSPITAL_COMMUNITY)
Admission: RE | Admit: 2021-02-02 | Discharge: 2021-02-02 | Disposition: A | Discharge status: Home / Self Care | Payer: Medicare Other | Source: Ambulatory Visit | Attending: Physician Assistant | Admitting: Physician Assistant

## 2021-02-02 ENCOUNTER — Other Ambulatory Visit: Payer: Self-pay

## 2021-02-02 DIAGNOSIS — J90 Pleural effusion, not elsewhere classified: Secondary | ICD-10-CM | POA: Diagnosis not present

## 2021-02-02 DIAGNOSIS — J449 Chronic obstructive pulmonary disease, unspecified: Secondary | ICD-10-CM

## 2021-02-02 DIAGNOSIS — J9611 Chronic respiratory failure with hypoxia: Secondary | ICD-10-CM

## 2021-02-02 DIAGNOSIS — Z9889 Other specified postprocedural states: Secondary | ICD-10-CM

## 2021-02-02 DIAGNOSIS — Z8616 Personal history of COVID-19: Secondary | ICD-10-CM

## 2021-02-02 DIAGNOSIS — R911 Solitary pulmonary nodule: Secondary | ICD-10-CM

## 2021-02-02 HISTORY — PX: IR THORACENTESIS ASP PLEURAL SPACE W/IMG GUIDE: IMG5380

## 2021-02-02 LAB — BODY FLUID CELL COUNT WITH DIFFERENTIAL
Eos, Fluid: 0 %
Lymphs, Fluid: 19 %
Monocyte-Macrophage-Serous Fluid: 70 % (ref 50–90)
Neutrophil Count, Fluid: 11 % (ref 0–25)
Total Nucleated Cell Count, Fluid: 84 cu mm (ref 0–1000)

## 2021-02-02 LAB — CBC WITH DIFFERENTIAL/PLATELET
Abs Immature Granulocytes: 0.05 10*3/uL (ref 0.00–0.07)
Basophils Absolute: 0 10*3/uL (ref 0.0–0.1)
Basophils Relative: 0 %
Eosinophils Absolute: 0 10*3/uL (ref 0.0–0.5)
Eosinophils Relative: 1 %
HCT: 36.7 % — ABNORMAL LOW (ref 39.0–52.0)
Hemoglobin: 10.9 g/dL — ABNORMAL LOW (ref 13.0–17.0)
Immature Granulocytes: 1 %
Lymphocytes Relative: 16 %
Lymphs Abs: 1.2 10*3/uL (ref 0.7–4.0)
MCH: 24.9 pg — ABNORMAL LOW (ref 26.0–34.0)
MCHC: 29.7 g/dL — ABNORMAL LOW (ref 30.0–36.0)
MCV: 83.8 fL (ref 80.0–100.0)
Monocytes Absolute: 0.8 10*3/uL (ref 0.1–1.0)
Monocytes Relative: 10 %
Neutro Abs: 5.8 10*3/uL (ref 1.7–7.7)
Neutrophils Relative %: 72 %
Platelets: 217 10*3/uL (ref 150–400)
RBC: 4.38 MIL/uL (ref 4.22–5.81)
RDW: 21.8 % — ABNORMAL HIGH (ref 11.5–15.5)
WBC: 7.8 10*3/uL (ref 4.0–10.5)
nRBC: 0 % (ref 0.0–0.2)

## 2021-02-02 LAB — ALBUMIN: Albumin: 3.4 g/dL — ABNORMAL LOW (ref 3.5–5.0)

## 2021-02-02 LAB — GRAM STAIN

## 2021-02-02 LAB — PROTEIN, TOTAL: Total Protein: 6.8 g/dL (ref 6.5–8.1)

## 2021-02-02 LAB — PROTEIN, PLEURAL OR PERITONEAL FLUID: Total protein, fluid: <3 g/dL

## 2021-02-02 LAB — LIPASE, BLOOD: Lipase: 47 U/L (ref 11–51)

## 2021-02-02 LAB — ALBUMIN, PLEURAL OR PERITONEAL FLUID: Albumin, Fluid: 1.5 g/dL

## 2021-02-02 LAB — GLUCOSE, PLEURAL OR PERITONEAL FLUID: Glucose, Fluid: 98 mg/dL

## 2021-02-02 LAB — LACTATE DEHYDROGENASE: LDH: 246 U/L — ABNORMAL HIGH (ref 98–192)

## 2021-02-02 MED ORDER — LIDOCAINE HCL 1 % IJ SOLN
INTRAMUSCULAR | Status: DC | PRN
  Administered 2021-02-02 (×2): 10 mL

## 2021-02-02 MED ORDER — LIDOCAINE HCL 1 % IJ SOLN
INTRAMUSCULAR | Status: AC
  Filled 2021-02-02: qty 20

## 2021-02-02 NOTE — Procedures (Signed)
PROCEDURE SUMMARY:  Successful image-guided right thoracentesis. Yielded 1.0 liters of clear yellow fluid. Patient tolerated procedure well. EBL: < 1 mL No immediate complications.  Specimen was  sent for labs. Post procedure CXR shows no pneumothorax.  Please see imaging section of Epic for full dictation.  Joaquim Nam PA-C 02/02/2021 9:08 AM

## 2021-02-03 LAB — TRIGLYCERIDES, BODY FLUIDS: Triglycerides, Fluid: 17 mg/dL

## 2021-02-03 LAB — CYTOLOGY - NON PAP

## 2021-02-07 ENCOUNTER — Ambulatory Visit (HOSPITAL_COMMUNITY)
Admission: RE | Admit: 2021-02-07 | Discharge: 2021-02-07 | Disposition: A | Discharge status: Home / Self Care | Payer: Medicare Other | Source: Ambulatory Visit | Attending: Internal Medicine | Admitting: Internal Medicine

## 2021-02-07 ENCOUNTER — Other Ambulatory Visit: Payer: Self-pay

## 2021-02-07 DIAGNOSIS — J90 Pleural effusion, not elsewhere classified: Secondary | ICD-10-CM

## 2021-02-07 DIAGNOSIS — J449 Chronic obstructive pulmonary disease, unspecified: Secondary | ICD-10-CM | POA: Diagnosis present

## 2021-02-07 DIAGNOSIS — J9611 Chronic respiratory failure with hypoxia: Secondary | ICD-10-CM | POA: Insufficient documentation

## 2021-02-07 DIAGNOSIS — Z8616 Personal history of COVID-19: Secondary | ICD-10-CM | POA: Diagnosis present

## 2021-02-07 DIAGNOSIS — R911 Solitary pulmonary nodule: Secondary | ICD-10-CM | POA: Insufficient documentation

## 2021-02-07 LAB — GLUCOSE, CAPILLARY: Glucose-Capillary: 101 mg/dL — ABNORMAL HIGH (ref 70–99)

## 2021-02-07 LAB — CULTURE, BODY FLUID W GRAM STAIN -BOTTLE: Culture: NO GROWTH

## 2021-02-07 MED ORDER — FLUDEOXYGLUCOSE F - 18 (FDG) INJECTION
5.7000 | Freq: Once | INTRAVENOUS | Status: AC | PRN
  Administered 2021-02-07: 5.5 via INTRAVENOUS

## 2021-02-08 ENCOUNTER — Other Ambulatory Visit: Payer: Self-pay | Admitting: Interventional Radiology

## 2021-02-08 DIAGNOSIS — I9789 Other postprocedural complications and disorders of the circulatory system, not elsewhere classified: Secondary | ICD-10-CM

## 2021-02-08 DIAGNOSIS — I714 Abdominal aortic aneurysm, without rupture, unspecified: Secondary | ICD-10-CM

## 2021-02-10 ENCOUNTER — Telehealth: Payer: Self-pay | Admitting: Emergency Medicine

## 2021-02-10 NOTE — Telephone Encounter (Signed)
Dr Chase Caller ordered a thoracentesis. I reviewed the cytology >> showed no malignant cells. I see that he got his PET scan on 4/26 and he has a consult with me on 5/5. He needs to keep the appt me so we can plan next steps since the cytology was negative. Thanks.

## 2021-02-10 NOTE — Telephone Encounter (Signed)
Left message for patient's daughter to call back. 

## 2021-02-10 NOTE — Telephone Encounter (Addendum)
I spoke with pt's daughter  She is asking for bronch results  She is not on DPR but is pt's healthcare POA  Advised we should still get verbal permission from pt to give her results  RB- are the results back yet?

## 2021-02-14 NOTE — Telephone Encounter (Signed)
Called and spoke with patient's daughter to let her know that since we do not have a DPR on file for patient I would have to call and ask patient if he gave [place] permission to go over results with her.  She called patient on speaker phone and patient stated that I did not have his permission to go over results with her and that he needed to be notified first. I expressed understanding and advised that I would have to call patient and talk to him first and then if he gave me permission I would call her back. Called and spoke with patient and asked him if he would like to discuss cytology results or if he would like to wait till his appointment on Thursday 5/5 with Dr. Lamonte Sakai. He stated that he would like to go over them now. Advised patient that cytology showed no malignant cells and was negative. Patient said that is great news. Advised patient to keep his appointment with Dr. Lamonte Sakai for Thursday to discuss results of cytology and PET scan in more details and next steps to take. Patient expressed understanding and then told me that I was allowed to call his daughter back and tell her the results since I had talked to him. I expressed understanding. Called and spoke with daughter to let her know that I now had permission to go over results with her from patient. Went over results with her and let her know to keep the appt for Thursday. She expressed understanding. Nothing further needed at this time.

## 2021-02-14 NOTE — Telephone Encounter (Signed)
Lmtcb for [employer].

## 2021-02-16 ENCOUNTER — Ambulatory Visit (INDEPENDENT_AMBULATORY_CARE_PROVIDER_SITE_OTHER): Payer: Medicare Other | Admitting: Emergency Medicine

## 2021-02-16 ENCOUNTER — Other Ambulatory Visit: Payer: Self-pay

## 2021-02-16 ENCOUNTER — Encounter: Payer: Self-pay | Admitting: Emergency Medicine

## 2021-02-16 DIAGNOSIS — R918 Other nonspecific abnormal finding of lung field: Secondary | ICD-10-CM | POA: Diagnosis not present

## 2021-02-16 DIAGNOSIS — C3411 Malignant neoplasm of upper lobe, right bronchus or lung: Secondary | ICD-10-CM | POA: Insufficient documentation

## 2021-02-16 NOTE — Progress Notes (Signed)
Subjective:    Patient ID: Jimmy Burke, male    DOB: Mar 07, 1933, 87 y.o.   MRN: [id number]  HPI 87 year old former smoker (80 pack years) who has seen Dr. Chase Caller in our office for shortness of breath.  Carries a history of atrial fibrillation, hypertension, COPD.  Evaluation included a CT [facility] of the chest [facility]16[facility] that reportedly showed moderate to advanced emphysematous change, moderate right-sided pleural effusion with associated atelectasis, a spiculated left lower lobe lesion 2 x 1.6 cm, subpleural nodular density overlying the lateral right upper lobe 0.9 x 0.5 cm. Started on triple therapy - unsure how much it has helped. His breathing was much improved by the R thora  The right pleural effusion was tapped by thoracentesis on 02/02/2021, had low protein suggestive of a possible transudate, cytology with reactive mesothelial cells and no malignant cells seen, culture negative  Subsequent PET scan done on 02/07/2021 reviewed by me, shows hypermetabolism in a 2.1 x 1.9 cm left lower lobe pulmonary nodule consistent with malignancy SUV 7.8, upper lobe subpleural nodule 1.2 x 0.7 cm with an SUV of 4.1, small AP window and small hilar nodes.    Review of Systems As per [facility]   Past Medical History:  Diagnosis Date  . Atrial fibrillation ([place])   . Chronic anticoagulation   . Endoleak post (EVAR) endovascular aneurysm repair   . Full dentures   . Hypertension      No family history on file.   Social History   Socioeconomic History  . Marital status: Widowed    Spouse name: Not on file  . Number of children: Not on file  . Years of education: Not on file  . Highest education level: Not on file  Occupational History  . Not on file  Tobacco Use  . Smoking status: Former Smoker    Packs/day: 1.00    Years: 55.00    Pack years: 55.00    Types: Cigarettes    Quit date: 08/1990    Years since quitting: 30.5  . Smokeless tobacco: Never Used  . Tobacco comment:  quit smoking cigarettes in 1990's  Vaping Use  . Vaping Use: Never used  Substance and Sexual Activity  . Alcohol use: Not Currently    Alcohol/week: 0.0 standard drinks  . Drug use: No  . Sexual activity: Not on file  Other Topics Concern  . Not on file  Social History Narrative  . Not on file   Social Determinants of Health   Financial Resource Strain: Not on file  Food Insecurity: Not on file  Transportation Needs: Not on file  Physical Activity: Not on file  Stress: Not on file  Social Connections: Not on file  Intimate Partner Violence: Not on file     No Known Allergies   Outpatient Medications Prior to Visit  Medication Sig Dispense Refill  . apixaban (ELIQUIS) 5 MG TABS tablet Take 5 mg by mouth daily.    Marland Kitchen aspirin 81 MG tablet Take 81 mg by mouth daily.    . cholecalciferol (VITAMIN D3) 25 MCG (1000 UT) tablet Take 1,000 Units by mouth daily.    Marland Kitchen desonide (DESOWEN) 0.05 % cream Apply 1 application topically daily as needed (irritation, dry skin).    Marland Kitchen diltiazem (CARDIZEM CD) 120 MG 24 hr capsule Take 120 mg by mouth daily.    Marland Kitchen diltiazem (TIAZAC) 120 MG 24 hr capsule Take 1 capsule by mouth daily.    Marland Kitchen donepezil (ARICEPT) 5  MG tablet Take 5 mg by mouth daily.     . Fluticasone-Umeclidin-Vilant 100-62.5-25 MCG/INH AEPB Inhale into the lungs.    . hydrochlorothiazide (HYDRODIURIL) 25 MG tablet TAKE 1/4 TABLET BY MOUTH EVERY DAY AS DIRECTED    . ketoconazole (NIZORAL) 2 % cream Apply 1 application topically daily as needed for irritation (dry skin).     . metoprolol succinate (TOPROL-XL) 100 MG 24 hr tablet Take 100 mg by mouth daily. Take with or immediately following a meal.    . Multiple Vitamin (MULTIVITAMIN WITH MINERALS) TABS tablet Take 1 tablet by mouth daily.    . simvastatin (ZOCOR) 20 MG tablet Take 20 mg by mouth daily.     No facility-administered medications prior to visit.          Objective:   Physical Exam Vitals:   02/16/21 1025  BP:  104/68  Pulse: (!) 118  Temp: 98.2 F (36.8 C)  TempSrc: Temporal  SpO2: 94%  Weight: 108 lb 3.2 oz (49.1 kg)  Height: 5\' 7"  (1.702 m)   Gen: Pleasant, thin elderly gentleman, in no distress,  normal affect  ENT: No lesions,  mouth clear,  oropharynx clear, no postnasal drip  Neck: No JVD, no stridor  Lungs: No use of accessory muscles, distant.  End expiratory wheezing on forced expiration  Cardiovascular: RRR, heart sounds normal, no murmur or gallops, no peripheral edema  Musculoskeletal: No deformities, no cyanosis or clubbing  Neuro: alert, awake, non focal  Skin: Warm, no lesions or rash      Assessment & Plan:  Pulmonary nodules/lesions, multiple We reviewed his imaging, testing, pleural fluid cytology today.  Discussed the possible causes of his pulmonary nodules.  I suspect that the hypermetabolic left lower lobe pulmonary nodule is a primary lung cancer.  The peripheral poorly formed right pulmonary nodule is less easily characterized but there may be some mild hypermetabolism in this location as well.  He had an associated pleural effusion, cytology negative that looks like it was probably transudate which would argue against a malignant effusion.  I have recommended to him that we could consider a repeat thoracentesis to increase the sensitivity on cytology to look for malignancy. I will present his case at the thoracic conference next week because he may be a candidate for stereotactic radiation without a tissue diagnosis.  Alternatively we may decide to wait and watch with a repeat scan to look for an interval increase in size.  If it does increase in size then maybe he would be a candidate for SBRT at that time.  I did explain to him that depending on the discussion it may be decided that he would need bronchoscopy with navigation and biopsy before any therapy would be offered.  I will speak with him after the conference to review.  Baltazar Apo, MD, PhD 02/16/2021, 5:32  PM [facility] [phone] or if no answer before 7:00PM call [phone] For any issues after 7:00PM please call eLink [phone]

## 2021-02-16 NOTE — Assessment & Plan Note (Signed)
We reviewed his imaging, testing, pleural fluid cytology today.  Discussed the possible causes of his pulmonary nodules.  I suspect that the hypermetabolic left lower lobe pulmonary nodule is a primary lung cancer.  The peripheral poorly formed right pulmonary nodule is less easily characterized but there may be some mild hypermetabolism in this location as well.  He had an associated pleural effusion, cytology negative that looks like it was probably transudate which would argue against a malignant effusion.  I have recommended to him that we could consider a repeat thoracentesis to increase the sensitivity on cytology to look for malignancy. I will present his case at the thoracic conference next week because he may be a candidate for stereotactic radiation without a tissue diagnosis.  Alternatively we may decide to wait and watch with a repeat scan to look for an interval increase in size.  If it does increase in size then maybe he would be a candidate for SBRT at that time.  I did explain to him that depending on the discussion it may be decided that he would need bronchoscopy with navigation and biopsy before any therapy would be offered.  I will speak with him after the conference to review.

## 2021-02-16 NOTE — Progress Notes (Signed)
Saw Jimmy Burke today

## 2021-02-16 NOTE — Patient Instructions (Signed)
We discussed your PET scan results, pleural fluid results today in office Dr. Lamonte Sakai will review your scans and your case with the thoracic conference next week. We will discuss the findings at thoracic conference next week by phone.  Depending on those discussions we will decide the timing of any repeat chest imaging, any further diagnostics or treatment. Please continue your inhaled medication as you have been taking it. Follow with Dr Lamonte Sakai in 1 month

## 2021-02-23 ENCOUNTER — Other Ambulatory Visit: Payer: Self-pay | Admitting: *Deleted

## 2021-02-23 NOTE — Progress Notes (Signed)
The proposed treatment discussed in cancer conference is for discussion purpose only and is not a binding recommendation. The patient was not physically examined nor present for their treatment options. Therefore, final treatment plans cannot be decided.  ?

## 2021-03-09 ENCOUNTER — Encounter: Payer: Self-pay | Admitting: *Deleted

## 2021-03-09 ENCOUNTER — Other Ambulatory Visit: Payer: Self-pay

## 2021-03-09 ENCOUNTER — Ambulatory Visit
Admission: RE | Admit: 2021-03-09 | Discharge: 2021-03-09 | Disposition: A | Discharge status: Home / Self Care | Payer: Medicare Other | Source: Ambulatory Visit | Attending: Interventional Radiology | Admitting: Interventional Radiology

## 2021-03-09 DIAGNOSIS — I714 Abdominal aortic aneurysm, without rupture, unspecified: Secondary | ICD-10-CM

## 2021-03-09 DIAGNOSIS — I9789 Other postprocedural complications and disorders of the circulatory system, not elsewhere classified: Secondary | ICD-10-CM

## 2021-03-09 HISTORY — PX: IR RADIOLOGIST EVAL & MGMT: IMG5224

## 2021-03-09 NOTE — Progress Notes (Signed)
Chief Complaint: Aortic Endograft Type II Endoleak, treated  Referring Physician(s): Dr. Denyce Robert  Pulmonary: Dr. Lamonte Sakai PCP: Dr. Malen Gauze   History of Present Illness: Jimmy Burke a 87 y.o.malepresenting as a scheduled follow up to [place] today, now SP his second late intervention for a persisting type II endoleak, and enlarging aneurysm sac.   Our most recent treatment was performed 05/13/2019.   I spoke with Jimmy Burke daughter today via telemedicine visit, and confirmed his identity with 2 personal identifiers. He is having additional health issues currently, and was not immediately available with his daughter.   History: He is a gentleman who is known to our service, with a history of AAA repair at [facility] with Dr. Maryjean Morn. This was performed with infrarenal fixation with a Gore Endograft in April of 2015. He was first treated for a persisting endoleak and enlarging sac by my late [place] partner, Dr. Barbie Banner, with embolization of contributing lumbar arteries 11/27/2016.   There was relative stability after the treatment, though ultimately the sac enlarged to about 7.9cm prompting [place] to re-treat him. This most recent treatment was performed at [facility] 04/2019 with the use of IVUS, and both a trans-caval approach and trans-abdominal approach. Combination of coil embolization and direct thrombin injection was used.   After treatment during our follow up, he has remained symptom free and with stable to decreased size of the excluded sac.   Interval History: Since our last visit, his daughter tells me that his health has somewhat deteriorated.  She says that he is quite frail, with decreasing weight, decreasing activity level, decreasing appetite.  He now has home O2 requirement.   He was treated at the end of April with edema and right pleural effusion.  CT chest showed suspicious nodule.    PET CT performed 02/07/21 shows LLL hypermetabolic nodule, compatible with  malignancy.  There is right upper lobe sub-pleural nodule which is also suspicious for malignancy.   Regarding the appearance of the treated AAA/endoleak on the PET, there is comparison made to prior CT scans.  CT 03/04/20 --> By my measurement, the largest AP diameter on the previous is  ~6.4cm.  Largest diagonal diameter previously is ~ 7.2cm. PETCT 02/07/21 --> Largest AP is ~6.4cm, largest diagonal is ~6.8cm  There are no secondary concerning signs such as inflammation or edema.    Past Medical History:  Diagnosis Date  . Atrial fibrillation ([place])   . Chronic anticoagulation   . Endoleak post (EVAR) endovascular aneurysm repair   . Full dentures   . Hypertension     Past Surgical History:  Procedure Laterality Date  . ABDOMINAL AORTIC ANEURYSM REPAIR    . CHOLECYSTECTOMY    . EYE SURGERY     Bilateral cataract surgery    . HERNIA REPAIR Right    inguinal hernia  . IR AORTAGRAM ABDOMINAL SERIALOGRAM  05/13/2019  . IR CT SPINE LTD  05/13/2019  . IR EMBO ARTERIAL NOT HEMORR HEMANG INC GUIDE ROADMAPPING  05/13/2019  . IR GENERIC HISTORICAL  05/02/2016   IR RADIOLOGIST EVAL & MGMT 05/02/2016 Marybelle Killings, MD [facility] INTERV RAD  . IR GENERIC HISTORICAL  11/15/2016   IR RADIOLOGIST EVAL & MGMT 11/15/2016 Marybelle Killings, MD [facility] INTERV RAD  . IR INTRAVASCULAR ULTRASOUND NON CORONARY  05/13/2019  . IR RADIOLOGIST EVAL & MGMT  02/06/2017  . IR RADIOLOGIST EVAL & MGMT  05/16/2017  . IR RADIOLOGIST EVAL & MGMT  12/03/2017  . IR RADIOLOGIST EVAL &  MGMT  05/04/2019  . IR RADIOLOGIST EVAL & MGMT  06/10/2019  . IR RADIOLOGIST EVAL & MGMT  03/09/2020  . IR THORACENTESIS ASP PLEURAL SPACE W/IMG GUIDE  02/02/2021  . IR US GUIDE VASC ACCESS RIGHT  05/13/2019  . IR US GUIDE VASC ACCESS RIGHT  05/13/2019  . IR VENOCAVAGRAM IVC  05/13/2019  . MULTIPLE TOOTH EXTRACTIONS    . RADIOLOGY WITH ANESTHESIA N/A 05/13/2019   Procedure: TYPE 2 ENDOLEAK;  Surgeon: Radiologist, Medication, MD;  Location: [facility];  Service:  Radiology;  Laterality: N/A;    Allergies: Patient has no known allergies.  Medications: Prior to Admission medications   Medication Sig Start Date End Date Taking? Authorizing Provider  apixaban (ELIQUIS) 5 MG TABS tablet Take 5 mg by mouth daily.    [provider]  aspirin 81 MG tablet Take 81 mg by mouth daily.    [provider]  cholecalciferol (VITAMIN D3) 25 MCG (1000 UT) tablet Take 1,000 Units by mouth daily.    [provider]  desonide (DESOWEN) 0.05 % cream Apply 1 application topically daily as needed (irritation, dry skin).    [provider]  diltiazem (CARDIZEM CD) 120 MG 24 hr capsule Take 120 mg by mouth daily. 04/28/19   [provider]  diltiazem (TIAZAC) 120 MG 24 hr capsule Take 1 capsule by mouth daily. 04/28/18   [provider]  donepezil (ARICEPT) 5 MG tablet Take 5 mg by mouth daily.     [provider]  Fluticasone-Umeclidin-Vilant 100-62.5-25 MCG/INH AEPB Inhale into the lungs. 01/20/21   [provider]  hydrochlorothiazide (HYDRODIURIL) 25 MG tablet TAKE 1/4 TABLET BY MOUTH EVERY DAY AS DIRECTED 02/03/20   [provider]  ketoconazole (NIZORAL) 2 % cream Apply 1 application topically daily as needed for irritation (dry skin).     [provider]  metoprolol succinate (TOPROL-XL) 100 MG 24 hr tablet Take 100 mg by mouth daily. Take with or immediately following a meal.    [provider]  Multiple Vitamin (MULTIVITAMIN WITH MINERALS) TABS tablet Take 1 tablet by mouth daily.    [provider]  simvastatin (ZOCOR) 20 MG tablet Take 20 mg by mouth daily.    [provider]     No family history on file.  Social History   Socioeconomic History  . Marital status: Widowed    Spouse name: Not on file  . Number of children: Not on file  . Years of education: Not on file  . Highest education level: Not on file  Occupational History  . Not on file   Tobacco Use  . Smoking status: Former Smoker    Packs/day: 1.00    Years: 55.00    Pack years: 55.00    Types: Cigarettes    Quit date: 08/1990    Years since quitting: 30.5  . Smokeless tobacco: Never Used  . Tobacco comment: quit smoking cigarettes in 1990's  Vaping Use  . Vaping Use: Never used  Substance and Sexual Activity  . Alcohol use: Not Currently    Alcohol/week: 0.0 standard drinks  . Drug use: No  . Sexual activity: Not on file  Other Topics Concern  . Not on file  Social History Narrative  . Not on file   Social Determinants of Health   Financial Resource Strain: Not on file  Food Insecurity: Not on file  Transportation Needs: Not on file  Physical Activity: Not on file  Stress: Not on file  Social Connections: Not on file       Review of Systems  Review of Systems: A 12 point ROS discussed and pertinent positives are indicated in the HPI above.  All other systems are negative.  Physical Exam No direct physical exam was performed (except for noted visual exam findings with Video Visits).    Vital Signs: There were no vitals taken for this visit.  Imaging: NM PET Image Initial (PI) Skull Base To Thigh  Result Date: 02/07/2021 CLINICAL DATA:  Initial treatment strategy for left lower lobe lung nodule. EXAM: NUCLEAR MEDICINE PET SKULL BASE TO THIGH TECHNIQUE: 5.5 mCi F-18 FDG was injected intravenously. Full-ring PET imaging was performed from the skull base to thigh after the radiotracer. CT data was obtained and used for attenuation correction and anatomic localization. Fasting blood glucose: 101 mg/dl COMPARISON:  Chest CT 01/14/2021 FINDINGS: Mediastinal blood pool activity: SUV max 2.2 Liver activity: SUV max NA NECK: No significant abnormal hypermetabolic activity in this region. Incidental CT findings: Dense bilateral carotid atherosclerotic calcifications. CHEST: The left lower lobe nodule of concern measures 2.1 by 1.9 cm on image 81 series 4,  maximum SUV 7.8, suspicious for malignancy. The right upper lobe subpleural nodule measures 1.2 by 0.7 cm on image 63 series 4 with maximum SUV 4.1, suspicious for malignancy. The bandlike density in the right middle lobe along the major fissure has mostly resolved although there is some residual faint ground-glass density in this region with maximum SUV 3.3. Small AP window lymph nodes measuring up to 0.8 cm in short axis, maximum SUV 3.8. Small hilar lymph nodes have activity mildly above background blood pool, including a right hilar lymph node with maximum SUV 3.4 and a left hilar lymph node with maximum SUV 2.7. Likely physiologic activity in the distal esophagus and along the small type 1 hiatal hernia, maximum SUV 4.5. Incidental CT findings: Coronary, aortic arch, and branch vessel atherosclerotic vascular disease. Moderate to large right and small left pleural effusions with associated passive atelectasis. ABDOMEN/PELVIS: No significant abnormal hypermetabolic activity in this region. Incidental CT findings: Pneumobilia. Aortoiliac atherosclerotic vascular disease. Aorta bi-iliac stent graft bypassing a large abdominal aortic aneurysm. Sigmoid colon diverticulosis. SKELETON: No significant abnormal hypermetabolic activity in this region. Incidental CT findings: Spondylosis. IMPRESSION: 1. The left lower lobe pulmonary nodule is hypermetabolic with maximum SUV of 7.8, suspicious for malignancy. The smaller right upper lobe subpleural nodule measuring 1.2 by 0.7 cm is moderately hypermetabolic, likewise concerning for malignancy. 2. There is some faint residual ground-glass opacity in the right middle lobe along a region of prior presumed atelectasis, maximum SUV 3.3, nonspecific but likely postinflammatory. 3. Accentuated activity in nonenlarged AP window lymph nodes, maximum SUV 3.8. Faintly accentuated bilateral hilar nodal activity. These nodes are indeterminate for malignancy versus reactive lymph nodes.  4. Other imaging findings of potential clinical significance: Aortic Atherosclerosis (ICD10-I70.0). Carotid atherosclerosis. Abdominal aortic aneurysm traversed by aorta bi-iliac stent graft. Coronary atherosclerosis. Moderate to large right and small left pleural effusions. Sigmoid colon diverticulosis. Pneumobilia. Spondylosis. Electronically Signed   By: Van Clines M.D.   On: 02/07/2021 15:21    Labs:  CBC: Recent Labs    01/26/21 1225 02/02/21 0950  WBC 10.0 7.8  HGB 10.6* 10.9*  HCT 34.0* 36.7*  PLT 275.0 217    COAGS: Recent Labs    01/26/21 1225  INR 1.1*  APTT 25.4    BMP: No results for input(s): NA, K, CL, CO2, GLUCOSE, BUN, CALCIUM, CREATININE, GFRNONAA, GFRAA  in the last 8760 hours.  Invalid input(s): CMP  LIVER FUNCTION TESTS: Recent Labs    02/02/21 0950  PROT 6.8  ALBUMIN 3.4*    TUMOR MARKERS: No results for input(s): AFPTM, CEA, CA199, CHROMGRNA in the last 8760 hours.  Assessment and Plan:  Jimmy Thune is a 87 year old gentleman who is now SP 2 late intervention for a type II endoleak of a prior EVAR for AAA, and enlarging sac.   Since our last visit, he has new imaging/PET diagnosis of LLL lung cancer, without path confirmation at this point.  His daughter tells me that he is becoming quite frail secondary to pulmonary disease.  His treatment plan is pending.   Regarding the endoleak, the sac of the excluded AAA continues to decrease in size.  I assured his daughter that at this time, this is the least of his concerns, but by all means if he has any acute new abdominal pain he should seek medical care/assessment.   Today during our conversation we discussed mainly his change in health status and the pending multi-disciplinary thoracic tumor board recommendation.  I assured her that we would have him on schedule  for another appointment in 1 year to continue surveillance of the treated AAA/endoleak.     He knows that should he have any  acute changes, such as abdominal pain, he should seek help at the ED.   Plan: - Office visit/tele-health visit in 12 monthswith repeat non-contrast CT abd/pelvis (if no other imaging performed within 1 month)  - Follow up on scheduled with pulmonary/oncology recs - Continue current care   Electronically Signed: Corrie Mckusick 03/09/2021, 10:19 AM   I spent a total of    25 Minutes in remote  clinical consultation, greater than 50% of which was counseling/coordinating care for late complication of previous endograft repair of AAA, SP treatment of endoleak.    Visit type: Audio only (telephone). Audio (no video) only due to patient's lack of internet/smartphone capability. Alternative for in-person consultation at [facility], [address], [place], [place]. This visit type was conducted due to national recommendations for restrictions regarding the COVID-19 Pandemic (e.g. social distancing).  This format is felt to be most appropriate for this patient at this time.  All issues noted in this document were discussed and addressed.

## 2021-03-21 ENCOUNTER — Ambulatory Visit (INDEPENDENT_AMBULATORY_CARE_PROVIDER_SITE_OTHER): Payer: Medicare Other | Admitting: Emergency Medicine

## 2021-03-21 ENCOUNTER — Encounter: Payer: Self-pay | Admitting: Emergency Medicine

## 2021-03-21 ENCOUNTER — Other Ambulatory Visit: Payer: Self-pay

## 2021-03-21 DIAGNOSIS — R918 Other nonspecific abnormal finding of lung field: Secondary | ICD-10-CM | POA: Diagnosis not present

## 2021-03-21 NOTE — Patient Instructions (Signed)
We will plan to repeat your CT scan of the chest in late July 2022 to follow pulmonary nodules for interval change. Follow Dr. Lamonte Sakai in early August after your CT so that we can review the results together. Depending on the CT results we may decide to revisit possible radiation therapy with the radiation oncologists.

## 2021-03-21 NOTE — Addendum Note (Signed)
Addended by: Valerie Salts on: 03/21/2021 02:45 PM   Modules accepted: Orders

## 2021-03-21 NOTE — Assessment & Plan Note (Signed)
His left lower lobe nodule is consistent with a slow-growing non-small cell lung cancer.  The right pleural-based nodule suspicious as well although smaller.  Discussed his case in thoracic conference and empiric radiation therapy was not recommended at this point.  Question whether navigational bronchoscopy would be well-tolerated given his overall clinical condition.  For now we have agreed to avoid invasive procedures, repeat his CT chest to look for interval change in size.  If he develops symptoms, if the nodule changes and is deemed to be most consistent with a slow-growing stage I lung cancer then we can revisit possible radiation therapy with the radiation oncologists.

## 2021-03-21 NOTE — Progress Notes (Signed)
Subjective:    Patient ID: Jimmy Burke, male    DOB: [birth year], 87 y.o.   MRN: [id number]  HPI 87 year old former smoker (80 pack years) who has seen Dr. Chase Caller in our office for shortness of breath.  Carries a history of atrial fibrillation, hypertension, COPD.  Evaluation included [facility] CT [facility] that [facility] showed moderate to advanced emphysematous change, moderate right-sided pleural effusion with associated atelectasis, a spiculated left lower lobe lesion 2 x 1.6 cm, subpleural nodular density overlying the lateral right upper lobe 0.9 x 0.5 cm. Started on triple therapy - unsure how much it has helped. His breathing was much improved by the R thora  The right pleural effusion was tapped by thoracentesis on 02/02/2021, had low protein suggestive of a possible transudate, cytology with reactive mesothelial cells and no malignant cells seen, culture negative  Subsequent PET scan done on 02/07/2021 reviewed by me, shows hypermetabolism in a 2.1 x 1.9 cm left lower lobe pulmonary nodule consistent with malignancy SUV 7.8, upper lobe subpleural nodule 1.2 x 0.7 cm with an SUV of 4.1, small AP window and small hilar nodes.   ROV 03/21/21 --follow-up visit for 87 former smoker with hypermetabolic left lower lobe pulmonary nodule, smaller right pleural-based nodule with mild hypermetabolism and a transudative right effusion with negative cytology on thoracentesis. He tells me that he had a fall about a month ago and impacted his L chest, has some residual tenderness.    Review of Systems As per HPI    Past Medical History:  Diagnosis Date  . Atrial fibrillation ([place])   . Chronic anticoagulation   . Endoleak post (EVAR) endovascular aneurysm repair   . Full dentures   . Hypertension      No family history on file.   Social History   Socioeconomic History  . Marital status: Widowed    Spouse name: Not on file  . Number of children: Not on file  .  Years of education: Not on file  . Highest education level: Not on file  Occupational History  . Not on file  Tobacco Use  . Smoking status: Former Smoker    Packs/day: 1.00    Years: 55.00    Pack years: 55.00    Types: Cigarettes    Quit date: 08/1990    Years since quitting: 30.6  . Smokeless tobacco: Never Used  . Tobacco comment: quit smoking cigarettes in 1990's  Vaping Use  . Vaping Use: Never used  Substance and Sexual Activity  . Alcohol use: Not Currently    Alcohol/week: 0.0 standard drinks  . Drug use: No  . Sexual activity: Not on file  Other Topics Concern  . Not on file  Social History Narrative  . Not on file   Social Determinants of Health   Financial Resource Strain: Not on file  Food Insecurity: Not on file  Transportation Needs: Not on file  Physical Activity: Not on file  Stress: Not on file  Social Connections: Not on file  Intimate Partner Violence: Not on file     No Known Allergies   Outpatient Medications Prior to Visit  Medication Sig Dispense Refill  . apixaban (ELIQUIS) 5 MG TABS tablet Take 5 mg by mouth daily.    Marland Kitchen aspirin 81 MG tablet Take 81 mg by mouth daily.    . cholecalciferol (VITAMIN D3) 25 MCG (1000 UT) tablet Take 1,000 Units by mouth daily.    Marland Kitchen desonide (  DESOWEN) 0.05 % cream Apply 1 application topically daily as needed (irritation, dry skin).    Marland Kitchen diltiazem (CARDIZEM CD) 120 MG 24 hr capsule Take 120 mg by mouth daily.    Marland Kitchen diltiazem (TIAZAC) 120 MG 24 hr capsule Take 1 capsule by mouth daily.    Marland Kitchen donepezil (ARICEPT) 5 MG tablet Take 5 mg by mouth daily.     . Fluticasone-Umeclidin-Vilant 100-62.5-25 MCG/INH AEPB Inhale into the lungs.    . hydrochlorothiazide (HYDRODIURIL) 25 MG tablet TAKE 1/4 TABLET BY MOUTH EVERY DAY AS DIRECTED    . ketoconazole (NIZORAL) 2 % cream Apply 1 application topically daily as needed for irritation (dry skin).     . metoprolol succinate (TOPROL-XL) 100 MG 24 hr tablet Take 100 mg by mouth  daily. Take with or immediately following a meal.    . Multiple Vitamin (MULTIVITAMIN WITH MINERALS) TABS tablet Take 1 tablet by mouth daily.    . simvastatin (ZOCOR) 20 MG tablet Take 20 mg by mouth daily.     No facility-administered medications prior to visit.          Objective:   Physical Exam Vitals:   03/21/21 1000  BP: 118/62  Pulse: 99  Temp: 98.1 F (36.7 C)  TempSrc: Temporal  SpO2: 94%  Weight: 108 lb 3.2 oz (49.1 kg)  Height: 5\' 7"  (1.702 m)   Gen: Pleasant, thin elderly gentleman, in no distress,  normal affect  ENT: No lesions,  mouth clear,  oropharynx clear, no postnasal drip  Neck: No JVD, no stridor  Lungs: No use of accessory muscles, distant.  End expiratory wheezing on forced expiration  Cardiovascular: RRR, heart sounds normal, no murmur or gallops, no peripheral edema  Musculoskeletal: No deformities, no cyanosis or clubbing  Neuro: alert, awake, non focal  Skin: Warm, no lesions or rash      Assessment & Plan:  Pulmonary nodules/lesions, multiple His left lower lobe nodule is consistent with a slow-growing non-small cell lung cancer.  The right pleural-based nodule suspicious as well although smaller.  Discussed his case in thoracic conference and empiric radiation therapy was not recommended at this point.  Question whether navigational bronchoscopy would be well-tolerated given his overall clinical condition.  For now we have agreed to avoid invasive procedures, repeat his CT chest to look for interval change in size.  If he develops symptoms, if the nodule changes and is deemed to be most consistent with a slow-growing stage I lung cancer then we can revisit possible radiation therapy with the radiation oncologists.  Baltazar Apo, MD, PhD 03/21/2021, 1:30 PM [facility] [phone] or if no answer before 7:00PM call [phone] For any issues after 7:00PM please call eLink [phone]

## 2021-03-24 ENCOUNTER — Ambulatory Visit (HOSPITAL_BASED_OUTPATIENT_CLINIC_OR_DEPARTMENT_OTHER): Payer: Medicare Other

## 2021-05-08 ENCOUNTER — Telehealth: Payer: Self-pay | Admitting: Emergency Medicine

## 2021-05-08 ENCOUNTER — Ambulatory Visit (HOSPITAL_BASED_OUTPATIENT_CLINIC_OR_DEPARTMENT_OTHER)
Admission: RE | Admit: 2021-05-08 | Discharge: 2021-05-08 | Disposition: A | Discharge status: Home / Self Care | Payer: Medicare Other | Source: Ambulatory Visit | Attending: Emergency Medicine | Admitting: Emergency Medicine

## 2021-05-08 ENCOUNTER — Other Ambulatory Visit: Payer: Self-pay

## 2021-05-08 DIAGNOSIS — R918 Other nonspecific abnormal finding of lung field: Secondary | ICD-10-CM | POA: Diagnosis not present

## 2021-05-08 NOTE — Telephone Encounter (Signed)
His CT chest shows that the pulmonary nodule continues to enlarge, is consistent with a slow growing lung cancer. We will need to follow this up at his planned OV, decide whether further testing is indicated.   It sound like his sx are an interval change, worsening compared with when I saw him in office. If he continues to have progressive SOB or pain then we need to have an APP see him sooner in office.

## 2021-05-08 NOTE — Telephone Encounter (Signed)
ATC x1.  LVM to return call.  First available visit with apps is 05/22/21 if he needs to be seen sooner.

## 2021-05-08 NOTE — Telephone Encounter (Signed)
Pt had CT scans done toay. Last AVS said to f/u after CT. Pt went to Dr Folk(cardiologist) said pt sounded like he had fluid in lungs again,that he didn't think it was heart..Pt is complaining of chest pain/lung pain. RB's next available is 8/25. Please advise. Can we use a blocked spot so pt can be seen sooner? Please advise [phone]

## 2021-05-08 NOTE — Telephone Encounter (Signed)
Primary Pulmonologist: Byrum Last office visit and with whom: 03/21/2021 What do we see them for (pulmonary problems): pulmonary nodules/lesions, multiples Last OV assessment/plan:  Assessment & Plan:  Pulmonary nodules/lesions, multiple His left lower lobe nodule is consistent with a slow-growing non-small cell lung cancer.  The right pleural-based nodule suspicious as well although smaller.  Discussed his case in thoracic conference and empiric radiation therapy was not recommended at this point.  Question whether navigational bronchoscopy would be well-tolerated given his overall clinical condition.  For now we have agreed to avoid invasive procedures, repeat his CT chest to look for interval change in size.  If he develops symptoms, if the nodule changes and is deemed to be most consistent with a slow-growing stage I lung cancer then we can revisit possible radiation therapy with the radiation oncologists.   Baltazar Apo, MD, PhD 03/21/2021, 1:30 PM [facility] [phone] or if no answer before 7:00PM call [phone] For any issues after 7:00PM please call eLink [phone]         Assessment & Plan Note by Collene Gobble, MD at 03/21/2021 1:28 PM  Author: Collene Gobble, MD Author Type: Physician Filed: 03/21/2021  1:30 PM  Note Status: Written Cosign: Cosign Not Required Encounter Date: 03/21/2021  Problem: Pulmonary nodules/lesions, multiple  Editor: Collene Gobble, MD (Physician)               His left lower lobe nodule is consistent with a slow-growing non-small cell lung cancer.  The right pleural-based nodule suspicious as well although smaller.  Discussed his case in thoracic conference and empiric radiation therapy was not recommended at this point.  Question whether navigational bronchoscopy would be well-tolerated given his overall clinical condition.  For now we have agreed to avoid invasive procedures, repeat his CT chest to look for interval change in  size.  If he develops symptoms, if the nodule changes and is deemed to be most consistent with a slow-growing stage I lung cancer then we can revisit possible radiation therapy with the radiation oncologists.        Patient Instructions by Collene Gobble, MD at 03/21/2021 10:15 AM  Author: Collene Gobble, MD Author Type: Physician Filed: 03/21/2021 10:34 AM  Note Status: Signed Cosign: Cosign Not Required Encounter Date: 03/21/2021  Editor: Collene Gobble, MD (Physician)               We will plan to repeat your CT scan of the chest in late July 2022 to follow pulmonary nodules for interval change. Follow Dr. Lamonte Sakai in early August after your CT so that we can review the results together. Depending on the CT results we may decide to revisit possible radiation therapy with the radiation oncologists.        Instructions  We will plan to repeat your CT scan of the chest in late July 2022 to follow pulmonary nodules for interval change. Follow Dr. Lamonte Sakai in early August after your CT so that we can review the results together. Depending on the CT results we may decide to revisit possible radiation therapy with the radiation oncologists.      Was appointment offered to patient (explain)?     Reason for call: Called and spoke with patient's daughter (DPR) and the patient.  He reports chest/lung pain for the past 2 days.  Felt completely out of breath today after he had his CT scan and had been off of his oxygen for about an  hour.  He got back on his oxygen and is now at home resting comfortably.  While he was off the oxygen his sats dropped to the high 80's and he c/o chest discomfort.   No coughing.  C/O sob.  Wears oxygen, levels in the high 80's until he gets the oxygen on.  He is wearing the oxygen all the time.  The first available for f/u with Dr. Lamonte Sakai is 8/25, please advise if this is ok. Dr. Lamonte Sakai, please advise.  (examples of things to ask: : When did symptoms start? Fever? Cough?  Productive? Color to sputum? More sputum than usual? Wheezing? Have you needed increased oxygen? Are you taking your respiratory medications? What over the counter measures have you tried?)  No Known Allergies  Immunization History  Administered Date(s) Administered   Influenza, High Dose Seasonal PF 07/25/2016, 07/18/2018, 07/09/2019   Pneumococcal Conjugate-13 12/25/2017   Pneumococcal Polysaccharide-23 12/31/2018   Tdap 03/13/2019

## 2021-05-09 NOTE — Telephone Encounter (Signed)
Jimmy Burke daughter is returning phone call. [name] phone number is [phone].

## 2021-05-09 NOTE — Telephone Encounter (Signed)
Spoke with patients daughter and appointment scheduled.

## 2021-05-09 NOTE — Telephone Encounter (Signed)
Attempted to call pt's daughter Vickey Huger but unable to reach. Left message for her to return call.

## 2021-05-10 ENCOUNTER — Ambulatory Visit: Payer: PRIVATE HEALTH INSURANCE | Admitting: Acute Care

## 2021-05-15 ENCOUNTER — Telehealth: Payer: Self-pay | Admitting: Emergency Medicine

## 2021-05-15 HISTORY — PX: IR THORACENTESIS ASP PLEURAL SPACE W/IMG GUIDE: IMG5380

## 2021-05-15 NOTE — Telephone Encounter (Signed)
I reviewed the notes and studies from the hospitalization.  I agree that the pleural fluid looks like a transudate related to his CHF, not malignancy./

## 2021-05-15 NOTE — Telephone Encounter (Signed)
Called and spoke with patient's daughter Vickey Huger. She stated that the patient went to [facility] on 05/10/21 since the SOB continued to get worse after she called our office. [facility] ended up doing a thoracentesis on his right lung and pulled 1.6L of fluid off. From her knowledge, the fluid was sent to pathology for additional testing. The doctor at [facility] was more concerned about him possibly being in CHF.   She wanted to know if she needed to have the hospital send a copy of the results and procedure notes to our office. I advised her that we can see everything in care everywhere.   She wants RB to take a look at the report. Report is located in Care Everywhere, the procedure notes as well as the pathology reports are located at the very bottom.   RB, can you please advise? Thanks!

## 2021-05-15 NOTE — Telephone Encounter (Signed)
Called and spoke with patient's daughter. She verbalized understanding and stated that she will contact his cardiologist today. She expressed appreciation for RB taking the time to look at his chart.   Nothing further needed at time of call.

## 2021-06-08 ENCOUNTER — Encounter: Payer: Self-pay | Admitting: Emergency Medicine

## 2021-06-08 ENCOUNTER — Ambulatory Visit (INDEPENDENT_AMBULATORY_CARE_PROVIDER_SITE_OTHER): Payer: Medicare Other | Admitting: Emergency Medicine

## 2021-06-08 ENCOUNTER — Other Ambulatory Visit: Payer: Self-pay

## 2021-06-08 DIAGNOSIS — R918 Other nonspecific abnormal finding of lung field: Secondary | ICD-10-CM | POA: Diagnosis not present

## 2021-06-08 DIAGNOSIS — J449 Chronic obstructive pulmonary disease, unspecified: Secondary | ICD-10-CM | POA: Diagnosis not present

## 2021-06-08 DIAGNOSIS — J90 Pleural effusion, not elsewhere classified: Secondary | ICD-10-CM | POA: Insufficient documentation

## 2021-06-08 NOTE — Patient Instructions (Addendum)
We reviewed your CT chest today.  Dr Lamonte Sakai will review your Ct findings with radiation oncology to see if you are a candidate for empiric radiation therapy to your pulmonary nodule. We will plan to follow your nodules with serial CT scan in 3 to 6 months.  We will determine the timing after we talk to radiation oncology. Continue your Trelegy 1 inhalation once daily. Keep albuterol available use 2 puffs if needed for shortness of breath, chest tightness, wheezing. Wear your oxygen while sleeping. Follow with Dr Lamonte Sakai in 1 month or next available

## 2021-06-08 NOTE — Progress Notes (Signed)
Subjective:    Patient ID: Jimmy Burke, male    DOB: 04-03-1933, 87 y.o.   MRN: [id number]  HPI 87 year old former smoker (80 pack years) who has seen Dr. Chase Caller in our office for shortness of breath.  Carries a history of atrial fibrillation, hypertension, COPD.  Evaluation [facility] showed moderate to advanced emphysematous change, moderate right-sided pleural effusion with associated atelectasis, a spiculated left lower lobe lesion 2 x 1.6 cm, subpleural nodular density overlying the lateral right upper lobe 0.9 x 0.5 cm. Started on triple therapy - unsure how much it has helped. His breathing was much improved by the R thora  The right pleural effusion was tapped by thoracentesis on 02/02/2021, had low protein suggestive of a possible transudate, cytology with reactive mesothelial cells and no malignant cells seen, culture negative  Subsequent PET scan done on 02/07/2021 reviewed by me, shows hypermetabolism in a 2.1 x 1.9 cm left lower lobe pulmonary nodule consistent with malignancy SUV 7.8, upper lobe subpleural nodule 1.2 x 0.7 cm with an SUV of 4.1, small AP window and small hilar nodes.   ROV 03/21/21 --follow-up visit for 88 former smoker with hypermetabolic left lower lobe pulmonary nodule, smaller right pleural-based nodule with mild hypermetabolism and a transudative right effusion with negative cytology on thoracentesis. He tells me that he had a fall about a month ago and impacted his L chest, has some residual tenderness.   ROV 06/08/21 --87 year old gentleman, former smoker, with atrial fibrillation, hypertension and COPD.  I saw him for an abnormal CT scan of the chest.  He had a spiculated left lower lobe pulmonary nodule and a subpleural nodular density of the lateral right upper lobe, right pleural effusion (tap 02/02/2021) that was cytology negative.  PET scan done in April showed that the left lower lobe nodule is  hypermetabolic as is the right upper lobe subpleural nodule.  I discussed his case at thoracic conference and empiric radiation was not recommended at this point.  We decided to try to avoid invasive procedures if possible and bronchoscopy was deferred. He was again hospitalized in late July with persistent right pleural fluid, transudative on repeat thoracentesis, possible pneumonia.  He was discharged home to complete antibiotics. He had a reassuring TTE with good LV fxn, PAH.   Repeat CT scan of the chest performed 05/08/2021 reviewed by me showed persistent emphysematous change, slight increase in size in spiculated lateral right upper lobe nodule now 8 x 14 mm, large right pleural effusion, left lower lobe spiculated nodule increased in size, now 2.4 x 2.5 cm consistent with bronchogenic carcinoma   Review of Systems As per HPI    Past Medical History:  Diagnosis Date   Atrial fibrillation ([place])    Chronic anticoagulation    Endoleak post (EVAR) endovascular aneurysm repair    Full dentures    Hypertension      No family history on file.   Social History   Socioeconomic History   Marital status: Widowed    Spouse name: Not on file   Number of children: Not on file   Years of education: Not on file   Highest education level: Not on file  Occupational History   Not on file  Tobacco Use   Smoking status: Former    Packs/day: 1.00    Years: 55.00    Pack years: 55.00    Types: Cigarettes    Quit date: 08/1990  Years since quitting: 30.8   Smokeless tobacco: Never   Tobacco comments:    quit smoking cigarettes in 1990's  Vaping Use   Vaping Use: Never used  Substance and Sexual Activity   Alcohol use: Not Currently    Alcohol/week: 0.0 standard drinks   Drug use: No   Sexual activity: Not on file  Other Topics Concern   Not on file  Social History Narrative   Not on file   Social Determinants of Health   Financial Resource Strain: Not on file  Food  Insecurity: Not on file  Transportation Needs: Not on file  Physical Activity: Not on file  Stress: Not on file  Social Connections: Not on file  Intimate Partner Violence: Not on file     No Known Allergies   Outpatient Medications Prior to Visit  Medication Sig Dispense Refill   apixaban (ELIQUIS) 5 MG TABS tablet Take 5 mg by mouth daily.     aspirin 81 MG tablet Take 81 mg by mouth daily.     cholecalciferol (VITAMIN D3) 25 MCG (1000 UT) tablet Take 1,000 Units by mouth daily.     desonide (DESOWEN) 0.05 % cream Apply 1 application topically daily as needed (irritation, dry skin).     diltiazem (CARDIZEM CD) 120 MG 24 hr capsule Take 120 mg by mouth daily.     diltiazem (TIAZAC) 120 MG 24 hr capsule Take 1 capsule by mouth daily.     donepezil (ARICEPT) 5 MG tablet Take 5 mg by mouth daily.      Fluticasone-Umeclidin-Vilant 100-62.5-25 MCG/INH AEPB Inhale into the lungs.     hydrochlorothiazide (HYDRODIURIL) 25 MG tablet TAKE 1/4 TABLET BY MOUTH EVERY DAY AS DIRECTED     ketoconazole (NIZORAL) 2 % cream Apply 1 application topically daily as needed for irritation (dry skin).      metoprolol succinate (TOPROL-XL) 100 MG 24 hr tablet Take 100 mg by mouth daily. Take with or immediately following a meal.     Multiple Vitamin (MULTIVITAMIN WITH MINERALS) TABS tablet Take 1 tablet by mouth daily.     simvastatin (ZOCOR) 20 MG tablet Take 20 mg by mouth daily.     No facility-administered medications prior to visit.          Objective:   Physical Exam Vitals:   06/08/21 1350  BP: 116/68  Pulse: (!) 110  Temp: 98.2 F (36.8 C)  TempSrc: Oral  SpO2: 98%  Weight: 113 lb 6.4 oz (51.4 kg)  Height: 5\' 7"  (1.702 m)   Gen: Pleasant, very thin elderly gentleman, in no distress,  normal affect  ENT: No lesions,  mouth clear,  oropharynx clear, no postnasal drip  Neck: No JVD, no stridor  Lungs: No use of accessory muscles, distant.  Decreased at the right base.  No  wheezing  Cardiovascular: RRR, heart sounds normal, no murmur or gallops, no peripheral edema  Musculoskeletal: No deformities, no cyanosis or clubbing  Neuro: alert, awake, non focal  Skin: Warm, no lesions or rash      Assessment & Plan:  Pulmonary nodules/lesions, multiple Hypermetabolic on PET scan, interval increase in size of both the left lower lobe and right upper lobe nodules.  Suspect these are synchronous stage I primaries.  He is a very risky candidate for bronchoscopy and biopsy, is not interested in invasive procedures if he can at all avoid.  He would be willing to consider empiric XRT if it were offered.  I will discuss this again  with thoracic conference.  COPD (chronic obstructive pulmonary disease) ([place]) Plan continue  Trelegy.  Albuterol as needed.  He has oxygen that he uses at night and will continue.  Recurrent right pleural effusion Transudative.  Suspect in large part due to secondary pulmonary hypertension from his underlying lung disease.  Need to keep him medically oxygenated, treat his COPD effectively.  Careful with diuretics given his overall status, risk for renal failure  Time spent 41 minutes  Baltazar Apo, MD, PhD 06/08/2021, 2:19 PM [facility] [phone] or if no answer before 7:00PM call [phone] For any issues after 7:00PM please call eLink [phone]

## 2021-06-08 NOTE — Assessment & Plan Note (Signed)
Hypermetabolic on PET scan, interval increase in size of both the left lower lobe and right upper lobe nodules.  Suspect these are synchronous stage I primaries.  He is a very risky candidate for bronchoscopy and biopsy, is not interested in invasive procedures if he can at all avoid.  He would be willing to consider empiric XRT if it were offered.  I will discuss this again with thoracic conference.

## 2021-06-08 NOTE — Assessment & Plan Note (Signed)
Transudative.  Suspect in large part due to secondary pulmonary hypertension from his underlying lung disease.  Need to keep him medically oxygenated, treat his COPD effectively.  Careful with diuretics given his overall status, risk for renal failure

## 2021-06-08 NOTE — Assessment & Plan Note (Signed)
Plan continue  Trelegy.  Albuterol as needed.  He has oxygen that he uses at night and will continue.

## 2021-06-15 ENCOUNTER — Other Ambulatory Visit: Payer: Self-pay | Admitting: *Deleted

## 2021-06-15 NOTE — Progress Notes (Signed)
The proposed treatment discussed in cancer conference is for discussion purpose only and is not a binding recommendation. The patient was not physically examined nor present for their treatment options. Therefore, final treatment plans cannot be decided.  ?

## 2021-07-15 HISTORY — PX: IR THORACENTESIS ASP PLEURAL SPACE W/IMG GUIDE: IMG5380

## 2021-07-18 ENCOUNTER — Telehealth: Payer: Self-pay | Admitting: Emergency Medicine

## 2021-07-18 DIAGNOSIS — R918 Other nonspecific abnormal finding of lung field: Secondary | ICD-10-CM

## 2021-07-18 NOTE — Telephone Encounter (Signed)
Please let them know that we discussed his case in Thoracic conference and that I will refer him to see Radiation Oncology to discuss treatment. Thank you

## 2021-07-18 NOTE — Telephone Encounter (Signed)
RB pts daughter is calling in regards to the referral for oncology.  Please advise.  See your last Plan from the last OV with pt on 06/08/21   We reviewed your CT chest today.  Dr Lamonte Sakai will review your Ct findings with radiation oncology to see if you are a candidate for empiric radiation therapy to your pulmonary nodule. We will plan to follow your nodules with serial CT scan in 3 to 6 months.  We will determine the timing after we talk to radiation oncology. Continue your Trelegy 1 inhalation once daily. Keep albuterol available use 2 puffs if needed for shortness of breath, chest tightness, wheezing. Wear your oxygen while sleeping. Follow with Dr Lamonte Sakai in 1 month or next available

## 2021-07-18 NOTE — Telephone Encounter (Signed)
Called Helene Kelp and there was no answer- LMTCB

## 2021-07-19 NOTE — Telephone Encounter (Signed)
Jimmy Burke daughter is returning phone call. [name] phone number is [phone].

## 2021-07-19 NOTE — Telephone Encounter (Signed)
Called and spoke with patient's daughter. She stated that the patient was his PCP's office now and they checked his O2 level on room air. His O2 sat was 73%. She is not sure what the PCP will recommend. I asked if they had checked his O2 while on the 4L and she stated that they were in the process of checking it now. I advised her that since his O2 levels have dropped into the 70s on room air, that he needs to remain on his O2 and follow the PCP's recommendations. She verbalized understanding.   Nothing further needed at time of call.

## 2021-07-19 NOTE — Telephone Encounter (Signed)
I would not increase the O2 without documented desaturations. Could actually contribute to impaired ventilation, make situation worse. We should try to arrange for a formal check on his resting and exertional Spo2, titrate him appropriately.

## 2021-07-19 NOTE — Telephone Encounter (Signed)
Called and spoke with patient's daughter Vickey Huger. She verbalized understanding and stated that Dr. Ida Rogue office had called her but she missed the call. They actually called her back towards the end of the call.   While on the phone, she did mention that she does not believe her dad is getting enough O2. He has been more confused lately and not able to follow along in simple convos. He has an appt to discuss this with his PCP later on this week. He is currently on 4L of O2. She wanted to know if she could increase his O2 to 5L. She has not been able to check his O2 sats and denied him showing any signs of being SOB.   RB, can you please advise? Thanks!

## 2021-07-20 ENCOUNTER — Telehealth: Payer: Self-pay | Admitting: Emergency Medicine

## 2021-07-20 ENCOUNTER — Telehealth: Payer: Self-pay | Admitting: Radiation Oncology

## 2021-07-20 NOTE — Telephone Encounter (Signed)
LVM for daughter to schedule pt consult

## 2021-07-20 NOTE — Telephone Encounter (Signed)
Spoke with the patient's daughter.  He is admitted in [facility], had a thoracentesis today, presumably recurrent transudate although I do not have the chemistries available to review at this time.  Unfortunately the transudate has been recurrent and seems to be accumulating more quickly necessitating thoracenteses.  I do not think his nodules or his pulmonary stranding look significantly different if you take into account the volume loss due to pleural effusions.  My goal is that we will be able to get him stabilized from a cardiopulmonary standpoint, volume standpoint and then get him in to be seen by Dr. Lisbeth Renshaw for possible treatment of his enlarging pulmonary nodules.  His symptoms are are driven by the pleural effusion and will have to try to work to get contributors to his pleural fluid under better control.

## 2021-07-20 NOTE — Telephone Encounter (Signed)
Called and spoke with pt's daughter Vickey Huger who states pt was admitted to [facility] hospital yesterday 10/5. Pt was having bouts of confusion and saw PCP. While pt was at PCP, O2 sats were very low in the 70s and stated that when pt got to the hospital, pt had to first be placed on 8L before pt's sats got up to 90s.   At the hospital, pt had a CT performed and Vickey Huger states that the CT showed that pt's lungs had a lot of fluid in them and the attending at [facility] stated that there is a change from the CT that was performed in August compared to this one.  Pt will be scheduled to see Dr. Lisbeth Renshaw next week and Vickey Huger wants to know if this is the right choice that pt should have. Vickey Huger wants to know if RB could call her to further discuss this with her.  Dr. Lamonte Sakai, please advise.

## 2021-07-24 NOTE — Progress Notes (Signed)
Thoracic Location of Tumor / Histology: Left Lower Lobe and Right Upper Lobe Lung  Patient presented with self diagnosed Covid in February 2022, with symptoms of loss of taste, loss of appetite, diarrhea and extreme fatigue.  Once He recovered he notes continued SOB.  By March 2022 he was noted to have some wheezing and hypoxemia.  PCP started him on oxygen.  CT Chest [facility]:  CT Super D Chest 05/08/2021: Enlarging spiculated right upper and left lower lobe nodules, shown to be hypermetabolic on 35/59/7416 and most consistent with primary bronchogenic carcinoma.  Large right pleural effusion, increased. Small left pleural effusion.  PET 02/07/2021: The left lower lobe pulmonary nodule is hypermetabolic with maximum SUV of 7.8, suspicious for malignancy. The smaller right upper lobe subpleural nodule measuring 1.2 by 0.7 cm is moderately hypermetabolic, likewise concerning for malignancy.  There is some faint residual ground-glass opacity in the right middle lobe along a region of prior presumed atelectasis, maximum SUV 3.3, nonspecific but likely postinflammatory.  Accentuated activity in nonenlarged AP window lymph nodes, maximum SUV 3.8. Faintly accentuated bilateral hilar nodal activity.  These nodes are indeterminate for malignancy versus reactive lymph nodes.  CT chest 01/16/2021: Moderate to advanced changes of centrilobular and  paraseptal emphysema. Moderate size right pleural effusion is noted.  Subsegmental atelectasis is also noted within the posterior right  middle lobe.  Spiculated lesion is identified within the  superior segment of left lower lobe worrisome for primary  bronchogenic carcinoma. This measures 2 x 1.6 cm.  Subpleural nodular density overlying the lateral right upper lobe measures 0.9 x 0.5 cm.    Cytology Report: Pleural Fluid, Right Thoracentesis 02/02/2021   Tobacco/Marijuana/Snuff/ETOH use: Former Smoker  Past/Anticipated interventions by cardiothoracic  surgery, if any:   Past/Anticipated interventions by Pulmonary, if any:  Dr. 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   -He is a very risky candidate for bronchoscopy and biopsy, is not interested in invasive procedures if he can at all avoid.   -He would be willing to consider empiric XRT if it were offered.   -I will discuss this again with thoracic conference. -Recurrent right pleural effusion Transudative.  Suspect in large part due to secondary pulmonary hypertension from his underlying lung disease.  Need to keep him medically oxygenated, treat his COPD effectively.  Careful with diuretics given his overall status, risk for renal failure    Past/Anticipated interventions by medical oncology, if any:    Signs/Symptoms Weight changes, if any: None Respiratory complaints, if any: Reports breathing better today. Hemoptysis, if any: Denies hemoptysis or cough. Pain issues, if any:  none  SAFETY ISSUES: Prior radiation? Neck radiation for skin cancer, [place] , give or take 25 years ago. Pacemaker/ICD? no   Possible current pregnancy? N/a Is the patient on methotrexate? no  Current Complaints / other details:   -Thoracentesis 07/2021- Right side 2.2 liters, and Left side 0.6 liters-Cytology sent  -Thoracentesis 05/2021- 1.6 liters drawn  -On Oxygen 2.5 liters

## 2021-07-25 ENCOUNTER — Ambulatory Visit
Admission: RE | Admit: 2021-07-25 | Discharge: 2021-07-25 | Disposition: A | Discharge status: Home / Self Care | Payer: Medicare Other | Source: Ambulatory Visit | Attending: Radiation Oncology | Admitting: Radiation Oncology

## 2021-07-25 ENCOUNTER — Encounter: Payer: Self-pay | Admitting: Radiation Oncology

## 2021-07-25 VITALS — Ht 67.0 in | Wt 113.0 lb

## 2021-07-25 DIAGNOSIS — C3432 Malignant neoplasm of lower lobe, left bronchus or lung: Secondary | ICD-10-CM | POA: Insufficient documentation

## 2021-07-25 DIAGNOSIS — C3411 Malignant neoplasm of upper lobe, right bronchus or lung: Secondary | ICD-10-CM

## 2021-07-25 HISTORY — DX: Malignant neoplasm of lower lobe, left bronchus or lung: C34.32

## 2021-07-25 NOTE — Progress Notes (Addendum)
Radiation Oncology         [phone] ________________________________  Initial Outpatient Consultation - Conducted via telephone due to current COVID-19 concerns for limiting patient exposure  I spoke with the patient to conduct this consult visit via telephone to spare the patient unnecessary potential exposure in the healthcare setting during the current COVID-19 pandemic. The patient was notified in advance and was offered a [facility] meeting to allow for face to face communication but unfortunately reported that they did not have the appropriate resources/technology to support such a visit and instead preferred to proceed with a telephone consult.   ________________________________  Name: Jimmy Burke        MRN: [id number]  Date of Service: 07/25/2021 DOB: September 01, 1933  CC:Houston Siren., MD  Collene Gobble, MD     REFERRING PHYSICIAN: Collene Gobble, MD   DIAGNOSIS: The primary encounter diagnosis was Malignant neoplasm of upper lobe of right lung [facility]). A diagnosis of Malignant neoplasm of lower lobe of left lung (HCC) was also pertinent to this visit.   HISTORY OF PRESENT ILLNESS: Jimmy Burke is a 87 y.o. male seen at the request of Dr. Lamonte Sakai for a putative diagnosis of stage I lung cancer.  Apparently he had COVID in February 2022 and remained short of breath, at [facility] at[facility] he had a CT chest without contrast on 01/14/2021 this showed a spiculated lesion in the superior segment of the left lower lobe measuring 2 x 1.6 cm subpleural nodular density overlying the right upper lobe was also noted measuring 9 mm in greatest dimension.  He had cardiac enlargement atherosclerotic and multivessel coronary disease as well as a moderate sized right pleural effusion, this was withdrawn and cytology from his pleural fluid was negative for malignancy, he was seen by [facility] and persistent right pleural effusion was again noted, pet imaging  on 02/07/2021 showed hypermetabolism within the left lower lobe nodule measuring 2.1 cm with an SUV max of 7.8 as well as the right upper lobe nodule measuring 12 mm with an SUV of 4.1, bandlike density in the right middle lobe along the major fissure had mostly resolved though there was some residual ground glass density with a maximum SUV of 3.3.  He also had small AP window and hilar adenopathy both were mildly above blood pool with SUVs between 3.4 and 3.8 and a separate left hilar node showed an SUV of 2.7.  Physiologic change along the distal esophagus and hiatal hernia was also noted with an SUV of 4.5.  His case was being evaluated and repeat CT scan on 05/08/2021 showed the right upper lobe nodule measuring 14 mm, the left lower lobe measuring 2.5 centimeters.  He was admitted last week to [facility] and had thoracentesis again on 07/21/21.  Dr. Lamonte Sakai recommended that we consider discussing definitive stereotactic body radiotherapy for what appears to be early non-small cell lung cancer in the left lower lobe and right upper lobe.  He is contacted today by phone to discuss these options along with his daughter Clarene Critchley.      PREVIOUS RADIATION THERAPY: No   PAST MEDICAL HISTORY:  Past Medical History:  Diagnosis Date   Atrial fibrillation (HCC)    Chronic anticoagulation    Endoleak post (EVAR) endovascular aneurysm repair    Full dentures    Hypertension    Malignant neoplasm of lower lobe of left lung ([place]) 07/25/2021       PAST  SURGICAL HISTORY: Past Surgical History:  Procedure Laterality Date   ABDOMINAL AORTIC ANEURYSM REPAIR     CHOLECYSTECTOMY     EYE SURGERY     Bilateral cataract surgery     HERNIA REPAIR Right    inguinal hernia   IR AORTAGRAM ABDOMINAL SERIALOGRAM  05/13/2019   IR CT SPINE LTD  05/13/2019   IR EMBO ARTERIAL NOT HEMORR HEMANG INC GUIDE ROADMAPPING  05/13/2019   IR GENERIC HISTORICAL  05/02/2016   IR RADIOLOGIST EVAL & MGMT 05/02/2016 Marybelle Killings, MD [facility] INTERV RAD   IR GENERIC HISTORICAL  11/15/2016   IR RADIOLOGIST EVAL & MGMT 11/15/2016 Marybelle Killings, MD [facility] INTERV RAD   IR INTRAVASCULAR ULTRASOUND NON CORONARY  05/13/2019   IR RADIOLOGIST EVAL & MGMT  02/06/2017   IR RADIOLOGIST EVAL & MGMT  05/16/2017   IR RADIOLOGIST EVAL & MGMT  12/03/2017   IR RADIOLOGIST EVAL & MGMT  05/04/2019   IR RADIOLOGIST EVAL & MGMT  06/10/2019   IR RADIOLOGIST EVAL & MGMT  03/09/2020   IR RADIOLOGIST EVAL & MGMT  03/09/2021   IR THORACENTESIS ASP PLEURAL SPACE W/IMG GUIDE  02/02/2021   IR THORACENTESIS ASP PLEURAL SPACE W/IMG GUIDE  05/2021   IR THORACENTESIS ASP PLEURAL SPACE W/IMG GUIDE  07/2021   IR US GUIDE VASC ACCESS RIGHT  05/13/2019   IR US GUIDE VASC ACCESS RIGHT  05/13/2019   IR VENOCAVAGRAM IVC  05/13/2019   MULTIPLE TOOTH EXTRACTIONS     RADIOLOGY WITH ANESTHESIA N/A 05/13/2019   Procedure: TYPE 2 ENDOLEAK;  Surgeon: Radiologist, Medication, MD;  Location: [place];  Service: Radiology;  Laterality: N/A;     FAMILY HISTORY: History reviewed. No pertinent family history.   SOCIAL HISTORY:  reports that he quit smoking about 30 years ago. His smoking use included cigarettes. He has a 55.00 pack-year smoking history. He has never used smokeless tobacco. He reports current alcohol use. He reports that he does not use drugs. The patient is widowed and lives in [place]. He enjoys watching TV and has a International aid/development worker named Cece.    ALLERGIES: Patient has no known allergies.   MEDICATIONS:  Current Outpatient Medications  Medication Sig Dispense Refill   albuterol (VENTOLIN HFA) 108 (90 Base) MCG/ACT inhaler PLEASE SEE ATTACHED FOR DETAILED DIRECTIONS     apixaban (ELIQUIS) 5 MG TABS tablet Take 5 mg by mouth daily.     aspirin 81 MG tablet Take 81 mg by mouth daily.     cefdinir (OMNICEF) 300 MG capsule Take by mouth.     cholecalciferol (VITAMIN D3) 25 MCG (1000 UT) tablet Take 1,000 Units by mouth daily.     desonide (DESOWEN)  0.05 % cream Apply 1 application topically daily as needed (irritation, dry skin).     diltiazem (CARDIZEM CD) 120 MG 24 hr capsule Take 120 mg by mouth daily.     diltiazem (TIAZAC) 120 MG 24 hr capsule Take 1 capsule by mouth daily.     donepezil (ARICEPT) 5 MG tablet Take 5 mg by mouth daily.      Fluticasone-Umeclidin-Vilant 100-62.5-25 MCG/INH AEPB Inhale into the lungs.     furosemide (LASIX) 40 MG tablet Take 40 mg by mouth daily.     hydrochlorothiazide (HYDRODIURIL) 25 MG tablet TAKE 1/4 TABLET BY MOUTH EVERY DAY AS DIRECTED     ketoconazole (NIZORAL) 2 % cream Apply 1 application topically daily as needed for irritation (dry skin).      metoprolol succinate (  TOPROL-XL) 50 MG 24 hr tablet Take 1 tablet by mouth daily.     Multiple Vitamin (MULTIVITAMIN WITH MINERALS) TABS tablet Take 1 tablet by mouth daily.     predniSONE (DELTASONE) 10 MG tablet PLEASE SEE ATTACHED FOR DETAILED DIRECTIONS     simvastatin (ZOCOR) 20 MG tablet Take 20 mg by mouth daily.     No current facility-administered medications for this encounter.     REVIEW OF SYSTEMS: On review of systems, the patient reports that he is doing much better since his thoracenteses last week. He denies any shortness of breath currently and denies cough or hemoptysis. He has not had any weight changes unintentionally. No other complaints are verbalized.      PHYSICAL EXAM:  Wt Readings from Last 3 Encounters:  07/25/21 113 lb (51.3 kg)  06/08/21 113 lb 6.4 oz (51.4 kg)  03/21/21 108 lb 3.2 oz (49.1 kg)     Pain Assessment Pain Score: 0-No pain/10  Unable to assess due to encounter type.  ECOG = 0  0 - Asymptomatic (Fully active, able to carry on all predisease activities without restriction)  1 - Symptomatic but completely ambulatory (Restricted in physically strenuous activity but ambulatory and able to carry out work of a light or sedentary nature. For example, light housework, office work)  2 - Symptomatic, <50%  in bed during the day (Ambulatory and capable of all self care but unable to carry out any work activities. Up and about more than 50% of waking hours)  3 - Symptomatic, >50% in bed, but not bedbound (Capable of only limited self-care, confined to bed or chair 50% or more of waking hours)  4 - Bedbound (Completely disabled. Cannot carry on any self-care. Totally confined to bed or chair)  5 - Death   Eustace Pen MM, Creech RH, Tormey DC, et al. [phone]). "Toxicity and response criteria of the [facility]". [place] Oncol. 5 (6): 649-55    LABORATORY DATA:  Lab Results  Component Value Date   WBC 7.8 02/02/2021   HGB 10.9 (L) 02/02/2021   HCT 36.7 (L) 02/02/2021   MCV 83.8 02/02/2021   PLT 217 02/02/2021   Lab Results  Component Value Date   NA 139 05/13/2019   K 4.1 05/13/2019   CL 103 05/13/2019   CO2 26 05/13/2019   No results found for: ALT, AST, GGT, ALKPHOS, BILITOT    RADIOGRAPHY: No results found.     IMPRESSION/PLAN: 1. Multifocal Stage IA2, cT1bN0M0 NSCLC of the RUL and Stage IA3, cT1cN0M0 NSCLC of the LLL. Dr. Lisbeth Renshaw discusses the patient's imaging findings and work up to date. He has multiple data points showing slow interval growth of his pulmonary nodules in the RUL and LLL. While ideally we would proceed with tissue confirmation with biopsy, the risks for this patient are considered high compared to the benefit of the procedure. He is not a good surgical candidate as well, and rather, Dr. Lisbeth Renshaw discusses the rationale to definitively treat with stereotactic body radiotherapy (SBRT), understanding the limitations of not having tissue confirmation. The patient is in agreement with this plan. We discussed the risks, benefits, short, and long term effects of radiotherapy, as well as the curative intent, and the patient is interested in proceeding. Dr. Lisbeth Renshaw discusses the delivery and logistics of radiotherapy and anticipates a course of 3-5 fxns of  radiotherapy, likely 5 based on location by CT. He will be contacted by simulation to coordinate planning at which time  he will sign written consent to proceed.  2. Mediastinal adenopathy. While this has not been definitive by PET or CT, there have been changes at intervals where there were radiographic evidence of enlarged nodes. We will follow this closely on subsequent imaging and involve medical oncology or present at thoracic oncology conference again if needed. 3. Bilateral pleural effusions. These have been felt to be due to his cardiac history of CHF. We do not anticipate that theses would re-accumulate prior to radiation, but he and his daughter understand the importance of letting us know about his breathing prior to and during treatment.    Given current concerns for patient exposure during the COVID-19 pandemic, this encounter was conducted via telephone.  The patient has provided two factor identification and has given verbal consent for this type of encounter and has been advised to only accept a meeting of this type in a secure network environment. The time spent during this encounter was 60 minutes including preparation, discussion, and coordination of the patient's care. The attendants for this meeting include Blenda Nicely, RN, Dr. Lisbeth Renshaw, Hayden Pedro  and Noel Christmas and his daughter Melany Guernsey.  During the encounter,  Blenda Nicely, RN, Dr. Lisbeth Renshaw, and Hayden Pedro were located at [facility] Radiation Oncology Department.  Haiden Rawlinson was located at home with his daughter Melany Guernsey.   The above documentation reflects my direct findings during this shared patient visit. Please see the separate note by Dr. Lisbeth Renshaw on this date for the remainder of the patient's plan of care.    Carola Rhine, [facility]   **Disclaimer: This note was dictated with voice recognition software. Similar sounding words can inadvertently be transcribed and this  note may contain transcription errors which may not have been corrected upon publication of note.**

## 2021-07-25 NOTE — Addendum Note (Signed)
Encounter addended by: Hayden Pedro, PA-C on: 07/25/2021 2:05 PM  Actions taken: Clinical Note Signed

## 2021-07-26 ENCOUNTER — Telehealth: Payer: Self-pay | Admitting: Emergency Medicine

## 2021-07-26 NOTE — Telephone Encounter (Signed)
Daughter, Vickey Huger states pt is hallucinating- seeing people and animals not there. Pt stated [name]'s son had a beard(he does not),and tattoos(does not)O2 levels are "okay." Pt became angry when daughter mentioned she was going to take him to dr. Marisa Sprinkles pcp can't see him until 9am 10/13. Vickey Huger wanting to know if she needs to be more aggressive and take him to the hospital or if pt can wait to be seen. Pt was prescribed prednisone and nebulizer medication over the weekend when he was at the hospital.Started medication Monday. Vickey Huger wondering if it's a medication reaction. Should she do half a dose of prednisone or eliminate it completely? Please advise [phone]

## 2021-07-26 NOTE — Telephone Encounter (Signed)
Call returned to patient daughter Helene Kelp, confirmed DOB. She states the patient is hallucinating seeing people that are not there, he is seeing animals, and agitated. He is more aggressive than usual. He continues to have conversations with people and animals that do not exist. She states he is highly combative and very angry. He is seeing tattoos on her body that she does not have. I encouraged her to go ahead and take him to the ED as I don't think there will be much we can do here in the office. Daughter states she does not want to take him to the ED because they will make him sit in the ED for a long period of time. No hx of UTI. Oxygen running at 90% per the daughter. She asked if she should give him a Ativan I made her aware I would ask MD as we do not want to make him more confused.   RB please advise. Thanks

## 2021-07-26 NOTE — Telephone Encounter (Signed)
I disagree with giving him ativan Unfortunately the best way to manage this is to have him seen in the ED - I know that they don't want to wait, but I believe this is the best plan.

## 2021-07-26 NOTE — Telephone Encounter (Signed)
Called patient's daughter but the call went straight to VM. Left message for her to call us back tomorrow after 8am.

## 2021-07-27 ENCOUNTER — Ambulatory Visit: Payer: Medicare Other | Admitting: Radiation Oncology

## 2021-07-31 ENCOUNTER — Encounter: Payer: Self-pay | Admitting: Radiation Oncology

## 2021-07-31 ENCOUNTER — Telehealth: Payer: Self-pay | Admitting: Emergency Medicine

## 2021-07-31 NOTE — Telephone Encounter (Signed)
Please let her know that I understand and share her concerns. He does not need to come in to be seen - based on these conversations it seems to me that hospice would be very appropriate. If they would like for me to make the referral then I am happy to go ahead and do so.

## 2021-07-31 NOTE — Telephone Encounter (Signed)
I have called and spoke with pts daughter and she is wanting to know the following from RB:  Should they cancel the OV with Dr. Lisbeth Renshaw?  She stated that the pt is very weak and cannot get around without help.  She stated that he cannot even stand with a cane and he is unsteady on his feet when he gets up.  Who needs to see him for determination for hospice?  Can she decide that he needs this or does he need to be seen by a doctor for determination?   Can RB place order for [facility] in [place] to send out a nurse to visits for the next 2 weeks at least?   His daughter stated that he was D/C from the hospital on 10/09 and he is now at 100 pounds or less.  He is sleeping a lot more.  Having hallucinations.  She stated that his oxygen levels range between 82% to 98%.  He did have labs done on Thursday and his potassium was low and he was dehydrated.  She stated that he has been drinking more water.  He has been talking about end of life things with family.  She stated that there is notes from [facility] that [place] can access.  RB please advise. thanks

## 2021-07-31 NOTE — Telephone Encounter (Signed)
I have called and LM on VM for the pts daugther to call us back.

## 2021-07-31 NOTE — Telephone Encounter (Signed)
Daughter states that his weight has dropped below 100 lbs. Was in hospital about two weeks ago where he was 113 lbs. Wonders if he has entered into needing hospice care rather than just oncology/radiation therapy.States that GP isnt giving "useful suggestions". Saw GP on 10/13. Would like more guidance as to how to best take care of her dad. Please advise.

## 2021-07-31 NOTE — Progress Notes (Signed)
We received notification from the patient's daughter that the patient has decided to forgo therapy and pursue hospice care after discussing his case with Dr. Lamonte Sakai and his team. Our staff has been made aware and treatment appointments have been cancelled.

## 2021-07-31 NOTE — Telephone Encounter (Signed)
Called and spoke with pt's daughter letting her know the info stated by RB. Jimmy Burke stated that they called PCP and PCP has made referral to hospice. Nothing further needed.

## 2021-08-01 ENCOUNTER — Encounter: Payer: Self-pay | Admitting: Radiation Oncology

## 2021-08-01 ENCOUNTER — Ambulatory Visit: Payer: Medicare Other | Admitting: Radiation Oncology

## 2021-08-07 ENCOUNTER — Ambulatory Visit: Payer: Medicare Other | Admitting: Radiation Oncology

## 2021-08-08 ENCOUNTER — Ambulatory Visit: Payer: Medicare Other | Admitting: Radiation Oncology

## 2021-08-09 ENCOUNTER — Ambulatory Visit: Payer: Medicare Other | Admitting: Radiation Oncology

## 2021-08-10 ENCOUNTER — Ambulatory Visit: Payer: Medicare Other | Admitting: Radiation Oncology

## 2021-08-11 ENCOUNTER — Ambulatory Visit: Payer: Medicare Other | Admitting: Radiation Oncology

## 2021-08-15 ENCOUNTER — Ambulatory Visit: Payer: Medicare Other | Admitting: Radiation Oncology

## 2021-08-17 ENCOUNTER — Ambulatory Visit: Payer: Medicare Other | Admitting: Radiation Oncology

## 2021-09-14 DEATH — deceased

## 2022-03-15 IMAGING — US IR THORACENTESIS ASP PLEURAL SPACE W/IMG GUIDE
1 series · 4 of 4 positions shown · non-contrast
Comparison: none

INDICATION: Patient with history of COPD, lung nodule, dyspnea. Imaging shows
new right pleural effusion. Request for diagnostic and therapeutic
thoracentesis.

[Series 1: ir (id) (id)/(id)/(id) ir · 4 of 4 slices shown]
[im 1/4]
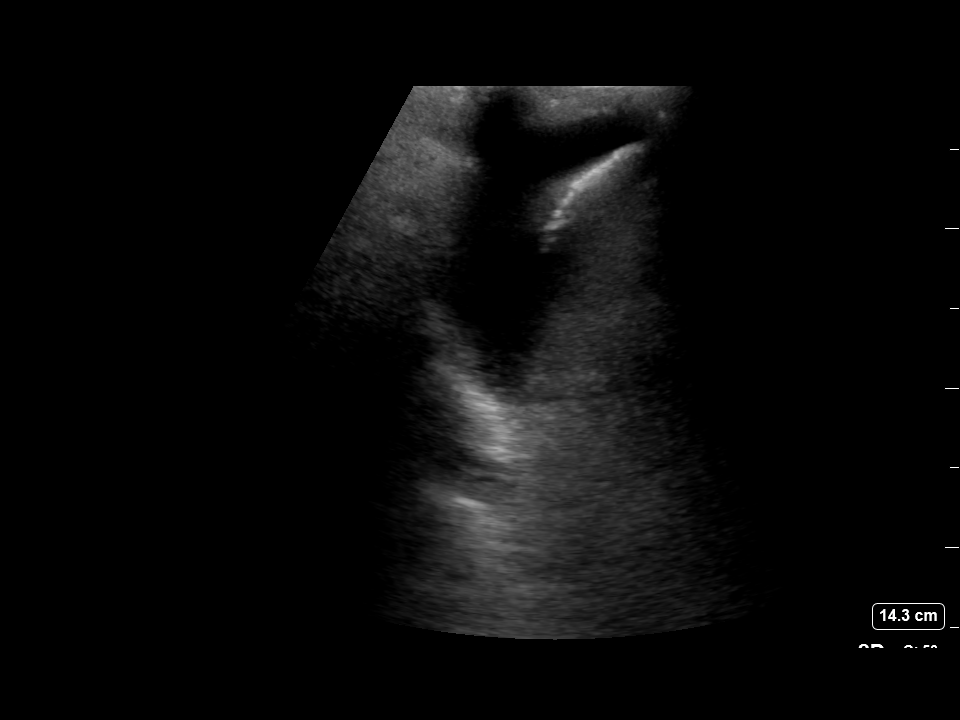
[im 2/4]
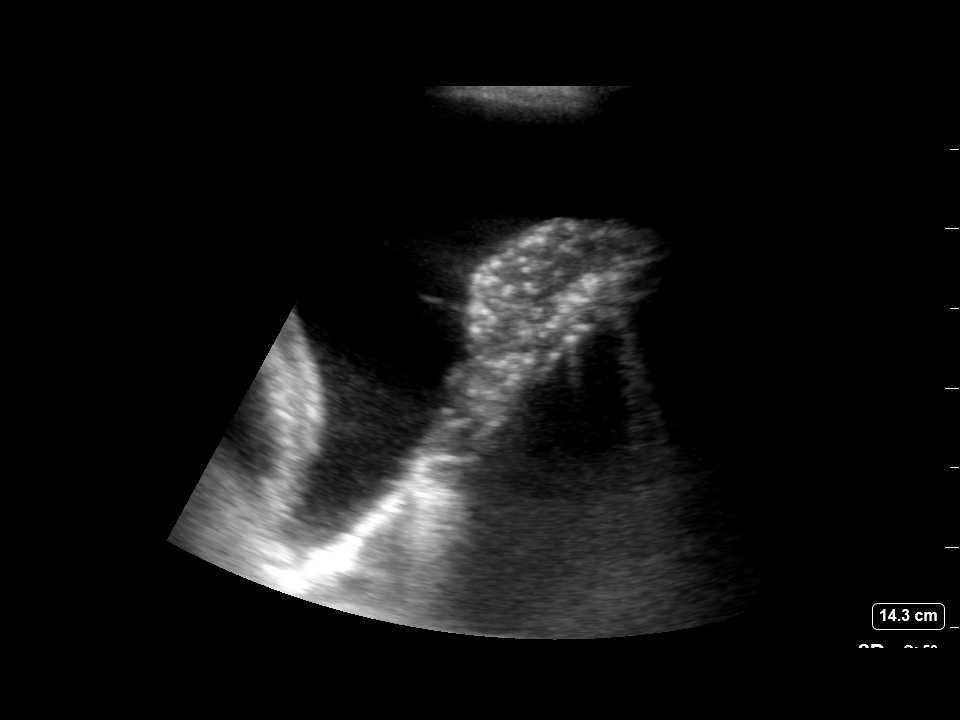
[im 3/4]
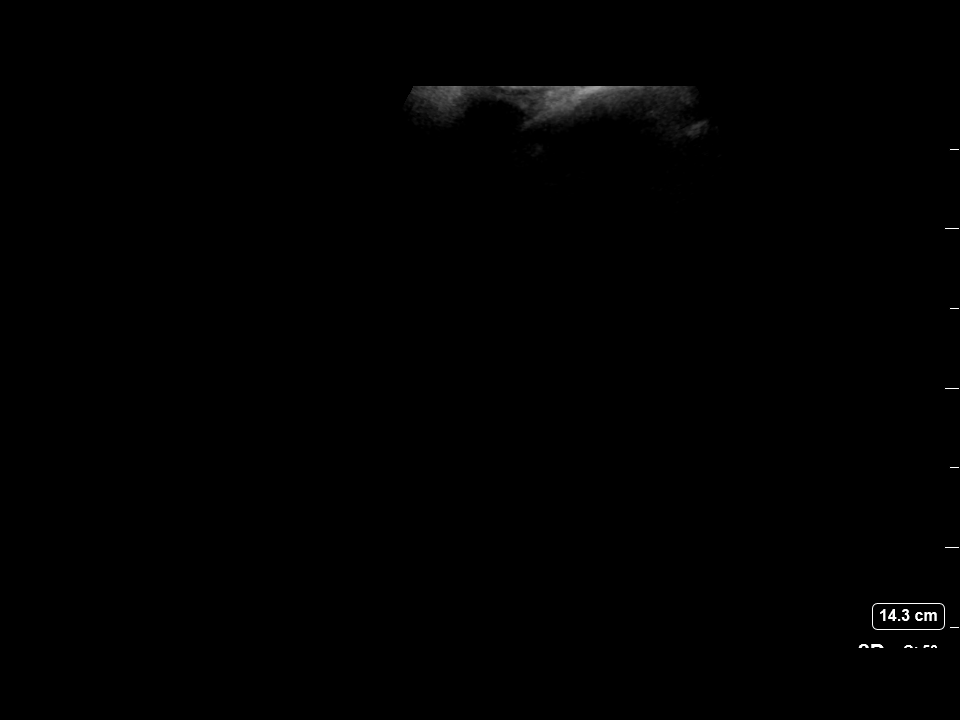
[im 4/4]
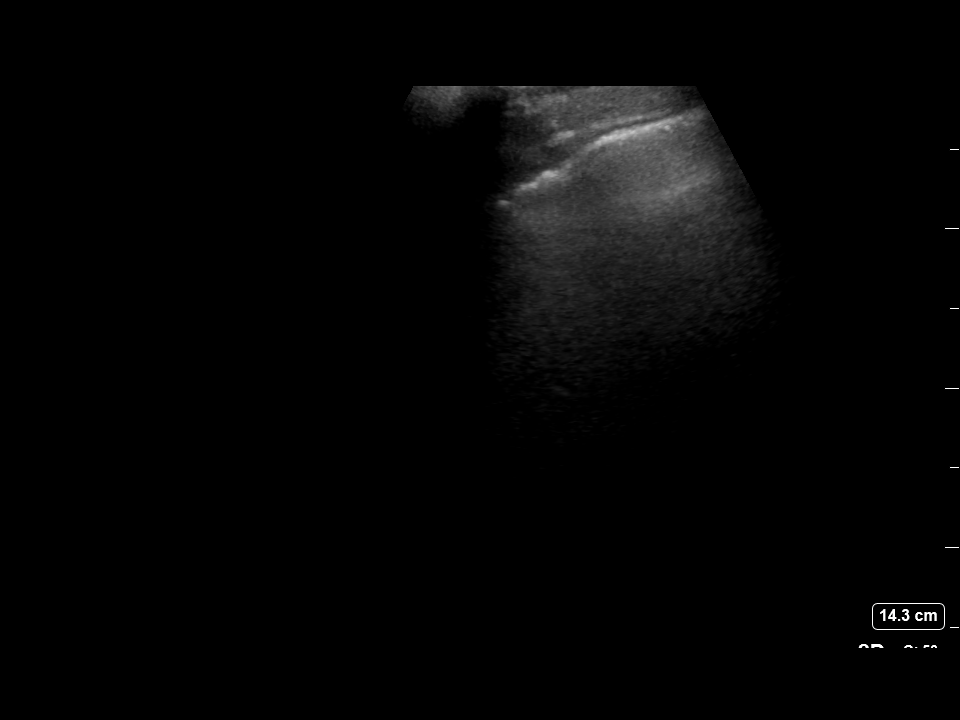

[4 of 4 positions shown; findings below may reference images not displayed]

EXAM:
ULTRASOUND GUIDED RIGHT THORACENTESIS

MEDICATIONS:
6 mL 1% lidocaine

COMPLICATIONS:
None immediate.

PROCEDURE:
An ultrasound guided thoracentesis was thoroughly discussed with the
patient and questions answered. The benefits, risks, alternatives
and complications were also discussed. The patient understands and
wishes to proceed with the procedure. Written consent was obtained.

Ultrasound was performed to localize and mark an adequate pocket of
fluid in the right chest. The area was then prepped and draped in
the normal sterile fashion. 1% Lidocaine was used for local
anesthesia. Under ultrasound guidance a choose catheter was
introduced. Thoracentesis was performed. The catheter was removed
and a dressing applied.
FINDINGS: A total of approximately 1.0 L of clear yellow fluid was removed.
Samples were sent to the laboratory as requested by the clinical
team.
IMPRESSION: Successful ultrasound guided right thoracentesis yielding 1.0 L of
pleural fluid.

Read by Pope, Oshaye

## 2022-03-15 IMAGING — DX DG CHEST 1V PORT
1 series · 1 of 1 positions shown · non-contrast
Comparison: 01/26/2021.  CT 01/14/2021.

CLINICAL DATA: Post right thoracentesis.

EXAM:
PORTABLE CHEST 1 VIEW

[chest ap]
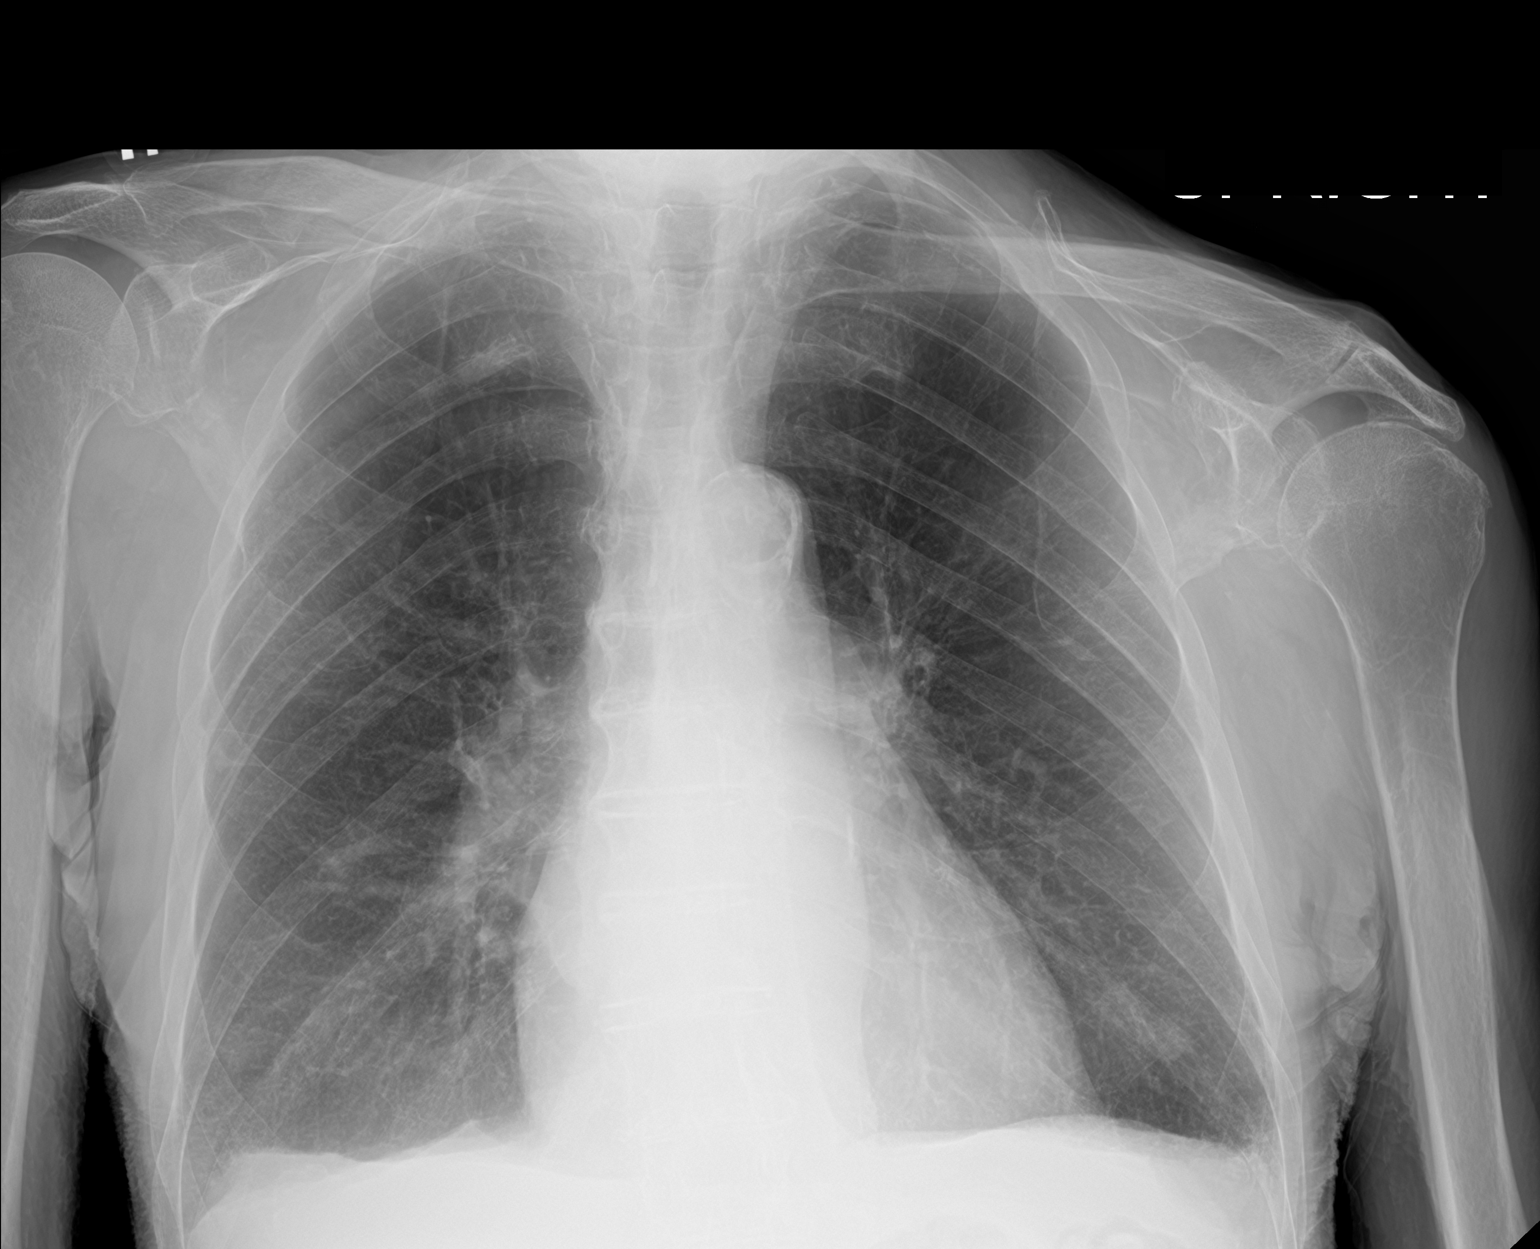

[1 of 1 positions shown; findings below may reference images not displayed]

FINDINGS: Mediastinum and hilar structures normal. Borderline cardiomegaly. No
pulmonary venous congestion. Interim near complete resolution of
previously identified right pleural effusion. No pneumothorax post
thoracentesis. Prominent nipple shadow on the left again noted.
Known left lower lobe mass not well demonstrated by standard chest
x-ray.
IMPRESSION: Interim near complete resolution of right pleural effusion post
thoracentesis. No pneumothorax. Known left lower lung mass not well
visualized by plain film exam.
